# Patient Record
Sex: Female | Born: 1950
Health system: Southern US, Community
[De-identification: ages and names within clinical notes are randomized; demographics above are authoritative.]

## PROBLEM LIST (undated history)

## (undated) DIAGNOSIS — K635 Polyp of colon: Secondary | ICD-10-CM

## (undated) DIAGNOSIS — R42 Dizziness and giddiness: Secondary | ICD-10-CM

## (undated) DIAGNOSIS — M810 Age-related osteoporosis without current pathological fracture: Secondary | ICD-10-CM

## (undated) DIAGNOSIS — K649 Unspecified hemorrhoids: Secondary | ICD-10-CM

## (undated) DIAGNOSIS — F32A Depression, unspecified: Secondary | ICD-10-CM

## (undated) DIAGNOSIS — F329 Major depressive disorder, single episode, unspecified: Secondary | ICD-10-CM

## (undated) HISTORY — DX: Polyp of colon: K63.5

## (undated) HISTORY — DX: Dizziness and giddiness: R42

## (undated) HISTORY — DX: Depression, unspecified: F32.A

## (undated) HISTORY — DX: Age-related osteoporosis without current pathological fracture: M81.0

## (undated) HISTORY — DX: Unspecified hemorrhoids: K64.9

## (undated) HISTORY — DX: Major depressive disorder, single episode, unspecified: F32.9

---

## 1981-02-25 HISTORY — PX: TUBAL LIGATION: SHX77

## 1998-10-13 ENCOUNTER — Other Ambulatory Visit: Admission: RE | Admit: 1998-10-13 | Discharge: 1998-10-13 | Payer: Self-pay | Admitting: Obstetrics & Gynecology

## 2000-07-08 ENCOUNTER — Other Ambulatory Visit: Admission: RE | Admit: 2000-07-08 | Discharge: 2000-07-08 | Payer: Self-pay | Admitting: Obstetrics & Gynecology

## 2000-09-25 HISTORY — PX: COLONOSCOPY: SHX174

## 2000-10-16 ENCOUNTER — Ambulatory Visit (HOSPITAL_COMMUNITY): Admission: RE | Admit: 2000-10-16 | Discharge: 2000-10-16 | Payer: Self-pay | Admitting: Gastroenterology

## 2000-10-16 ENCOUNTER — Encounter (INDEPENDENT_AMBULATORY_CARE_PROVIDER_SITE_OTHER): Payer: Self-pay | Admitting: Specialist

## 2001-08-07 ENCOUNTER — Other Ambulatory Visit: Admission: RE | Admit: 2001-08-07 | Discharge: 2001-08-07 | Payer: Self-pay | Admitting: Obstetrics & Gynecology

## 2001-08-12 ENCOUNTER — Inpatient Hospital Stay (HOSPITAL_COMMUNITY): Admission: EM | Admit: 2001-08-12 | Discharge: 2001-08-15 | Payer: Self-pay

## 2001-08-12 ENCOUNTER — Encounter: Payer: Self-pay | Admitting: Specialist

## 2002-09-30 ENCOUNTER — Other Ambulatory Visit: Admission: RE | Admit: 2002-09-30 | Discharge: 2002-09-30 | Payer: Self-pay | Admitting: Obstetrics & Gynecology

## 2003-12-05 ENCOUNTER — Other Ambulatory Visit: Admission: RE | Admit: 2003-12-05 | Discharge: 2003-12-05 | Payer: Self-pay | Admitting: Obstetrics & Gynecology

## 2005-01-30 ENCOUNTER — Other Ambulatory Visit: Admission: RE | Admit: 2005-01-30 | Discharge: 2005-01-30 | Payer: Self-pay | Admitting: Obstetrics and Gynecology

## 2007-05-26 ENCOUNTER — Encounter: Admission: RE | Admit: 2007-05-26 | Discharge: 2007-05-26 | Payer: Self-pay | Admitting: Obstetrics & Gynecology

## 2007-06-10 ENCOUNTER — Encounter: Admission: RE | Admit: 2007-06-10 | Discharge: 2007-06-10 | Payer: Self-pay | Admitting: Obstetrics & Gynecology

## 2010-07-13 NOTE — H&P (Signed)
[facility]  Patient:    Melanie Chen, Melanie Chen Visit Number: [id number] MRN: [id number]          Service Type: SUR Location: 4W 0463 01 Attending Physician:  Lubertha South Dictated by:   Druscilla Brownie Shela Nevin, P.A. Admit Date:  08/12/2001                           History and Physical  CHIEF COMPLAINT:  "Pain in my left ankle and heel."  HISTORY OF PRESENT ILLNESS:  The patient is a 60 year old lady who was at her home last evening when she was descending some stairs.  She had a basketful of laundry in her hands.  She missed the last couple of steps of these stairs and twisted her ankle with immediate pain into her left foot and ankle.  They iced and elevated during the night but became more uncomfortable this morning, and she was brought to the emergency room where x-rays of the ankle reveal a nondisplaced fracture of the fibula.  The views of the calcaneus on the ankle x-ray showed irregularity, so the radiologist ordered further studies.  An MRI showed a displaced calcaneal fracture.  Dr. Otelia Sergeant is on call today and, after evaluation, may take the patient for surgical intervention.  PAST MEDICAL HISTORY:  The patient has been in good health throughout her lifetime.  She has only had a tubal ligation.  MEDICATIONS:  Aricept is the only medication.  SOCIAL HISTORY:  Negative for ETOH or alcohol.  ALLERGIES:  None known.  REVIEW OF SYSTEMS:  CNS:  No seizure disorder, paralysis, numbness, or double vision.  RESPIRATORY:  No productive cough, no hemoptysis, no shortness of breath.  CARDIOVASCULAR:  No chest pain, no angina, no orthopnea. GASTROINTESTINAL:  No nausea, vomiting, melena, or bloody stools. GENITOURINARY:  No discharge, dysuria, hematuria.  MUSCULOSKELETAL:  Primarily in present illness.  PHYSICAL EXAMINATION:  GENERAL:  Alert, cooperative, friendly 60 year old female who is fully alert. She is on an emergency room  stretcher.  VITAL SIGNS:  Blood pressure 132/80, pulse 58, respirations 20, temperature 97.3.  HEENT:  Normocephalic.  PERRLA.  Oropharynx is clear.  CHEST:  Clear to auscultation.  No rhonchi, no rales.  HEART:  Regular rate and rhythm.  No murmurs are heard.  ABDOMEN:  Soft and nontender.  Liver and spleen not felt.  GENITOURINARY, RECTAL, PELVIC, BREASTS:  Not done, not pertinent to present illness.  EXTREMITIES:  There is a moderate amount of swelling about the left ankle and heel with some discoloration.  NEUROLOGIC:  Intact.  ADMISSION DIAGNOSIS:  Left calcaneal fracture, nondisplaced fibular fracture on the left.  PLAN:  The patient will be placed in splint for rest and probably on to ORIF of the calcaneal fracture on the left. Dictated by:   Druscilla Brownie. Shela Nevin, P.A. Attending Physician:  Lubertha South DD:  08/12/01 TD:  08/13/01 Job: [id number]

## 2010-07-13 NOTE — Op Note (Signed)
[facility]  Patient:    Melanie Chen, Melanie Chen Visit Number: [id number] MRN: [id number]          Service Type: SUR Location: 4W 4782 01 Attending Physician:  Lubertha South Dictated by:   Kerrin Champagne, M.D. Proc. Date: 08/12/01 Admit Date:  08/12/2001                             Operative Report  PREOPERATIVE DIAGNOSES:  Left calcaneus fracture, tongue-type fracture.  POSTOPERATIVE DIAGNOSES:  Left calcaneus fracture, tongue-type fracture.  PROCEDURE:  Closed manipulation and reduction of left calcaneus fracture using a calcaneal traction pin under C-arm fluoro and percutaneous fixation of calcaneus fracture using two large Steinmann pins and application of a short leg cast in equinus position at 20 degrees.  SURGEON:  Kerrin Champagne, M.D.  ASSISTANT:  Della Goo, P.A.  ANESTHESIA:  GOT supplemented with local infiltration at the pin sites using 5 cc of Marcaine 0.5% plain in addition to the area of the calcaneal contraction pin area. Dr. Shireen Quan.  TOURNIQUET TIME:  Zero minutes.  ESTIMATED BLOOD LOSS:  5 cc.  BRIEF CLINICAL NOTE:  The patient is a 60 year old female who sustained a left calcaneal fracture when she missed the last two or three steps on a stairway landing solidly on her left heel last evening. She had immediate pain and discomfort, elevated it, used ice and presented to the emergency room this morning and was found to have a displaced left calcaneus fracture. CT scan demonstrated the intra-articular nature of the fracture with widening of the heel and apparent tongue-type fracture pattern into the posterior joint line. An anterior process fracture was also noted with CT scan and plain radiographs. She was brought to the operating room to undergo an attempt at closed manipulation and pinning, also open reduction internal fixation medially.  DESCRIPTION OF PROCEDURE:  After adequate general anesthesia, the patient  in prone position, chest rolls, all pressure points well padded, tourniquet about the left upper thigh not inflated. Standard preoperative antibiotics left lower extremity is prepped from the toes to the upper calf with duraprep solution, draped in the usual manner. C-arm fluoro is brought into the field even prior to draping and prepping to allow for examination of the fracture and determination of proper angles for examining the fracture intraoperatively. After sterile prep and drape, the C-arm fluoro was brought into the field. A calcaneal pin was placed over the inferior posterior aspect of the calcaneal tuberosity to allow for traction purposes and placing a pin wall away from where the expected internal fixation pins will be placed. The patients fracture was then reduced with longitudinal traction, pressure over the lateral aspect of the mid portion of the calcaneus just posterior inferior to the lateral malleolus which also was fractured. Initially this was felt to reduce the fracture. A Steinmann pin was placed through a stab incision over the posterior mid upper 1/3 of the calcaneal tuberosity through a stab incision. A large Steinmann pin was passed parallel to the lateral cortex initially placed. Examination using C-arm fluoro demonstrated a good position and alignment of both Broden views as well as lateral views and AP views; however, Tiburcio Pea view demonstrated that the fracture still remained displaced and widened. Therefore this pin was then replaced laterally and then used to apply medial pressure against the lateral calcaneal fracture fragment reducing the posterior joint surface and the widened aspect of the heel.  Reducing the sustentacular fragment to this fragment. The pin was then driven across into the talus and found its way into the talocuboid region near the area of the anterior process and did appear to provide some fixation of the anterior process fracture that was  present here. Next, a second stab incision was made over the posterior medial aspect of the mid upper third of the calcaneus and a second large Steinmann pin was then passed through the calcaneus in line w the medial cortex of the calcaneus entering into the sustentacular fragment and fixing this fragment in addition to the talar neck. This held both the medial and lateral fracture fragments of the calcaneus reduced in a proper orientation with reduction of the widened aspect of the calcaneal body. Views were then taken both AP view, lateral views as well as several Ill[place] Level views were obtained, each demonstrating excellent reduction of the posterior joint surface of the calcaneus. The anterior process fracture fragment also appeared to be reduced nicely. The fibular fracture site also demonstrated good appearance of the AP and lateral views. Each of the long Steinmann pins were then carefully bent. The traction pin was removed, cut medially and then withdrawn through the lateral aspect of the heel. Xeroform gauze placed over the stab incisions for insertion of this pin. Additional xeroform placed at the pin skin interval for both the large Steinmann pins over the posterior heel. The 4 x 4s fixed to the skin with sterile Webril. A well padded short leg fiberglass cast was applied with the ankle in about 20 degrees of equinus. Care was taken to pad each of the bent Steinmann pins. Also note the Steinmann pins had caps placed over them to protect them from the cast material itself. The patient was then returned to a supine position, reactivated and returned to the recovery room in satisfactory condition. All instrument and sponge counts were correct. Dictated by:   Kerrin Champagne, M.D. Attending Physician:  Lubertha South DD:  08/12/01 TD:  08/13/01 Job: [id number]

## 2010-07-13 NOTE — Op Note (Signed)
[facility]  Patient:    Melanie Chen, Melanie Chen Visit Number: [id number] MRN: [id number]          Service Type: END Location: ENDO Attending Physician:  Nelda Marseille Proc. Date: 10/16/00 Adm. Date:  10/16/2000   CC:         Freddy Finner, M.D.   Operative Report  PROCEDURE:  Colonoscopy with biopsy.  INDICATIONS FOR PROCEDURE:  Family history of colon cancer.  Consent was signed after risks, benefits, methods, and options were thoroughly discussed in the office.  MEDICINES USED:  Demerol 70, Versed 7.  DESCRIPTION OF PROCEDURE:  Rectal inspection was pertinent for small external hemorrhoids. Digital exam was negative. The video pediatric colonoscope was inserted and with lots of difficulty due to a tortuous colon, we were able to advance to the cecum. This did require first rolling her on her back and then on her right side and various abdominal pressures. The patient tolerated insertion well. We just had trouble controlling the loop and some trouble with some tortuosity. No obvious abnormality was seen on insertion. The cecum was identified by the appendiceal orifice and the ileocecal valve. In fact, the scope was inserted a short ways into the terminal ileum which was normal. The scope was then slowly withdrawn, the TI was normal. The prep was adequate. There was some liquid stool that required washing and suctioning. On slow withdrawal through the colon, the cecum and the ascending were normal. In the proximal transverse colon, a questionable tiny 2 mm polyp was seen and was cold biopsied x 3. The scope was further withdrawn. When we fell back around a tortuous curve, we did readvance around the curve to try to decrease the chance of missing things but on slow withdrawal back to the rectum, no additional findings were seen. Once back in the rectum, the scope was retroflexed pertinent for some internal hemorrhoids. The scope  was straightened, air was suctioned, scope removed. The patient tolerated the procedure well. There was no obvious or immediate complications.  ENDOSCOPIC DIAGNOSIS: 1. Small internal/external hemorrhoids. 2. Tortuous sigmoid and flexure. 3. Questionable tiny proximal transverse polyp status post cold biopsy. 4. Otherwise within normal limits to the cecum and the terminal ileum.  PLAN:  GI follow-up p.r.n.  Await pathology. Will probably recheck colonic screening in five years with contrast barium enema or a virtual colonoscopy at one time at that juncture due to her tortuosity. Otherwise return care to Dr. Jennette Kettle for the customary health care maintenance to include yearly rectals and guaiacs. Attending Physician:  Nelda Marseille DD:  10/16/00 TD:  10/17/00 Job: [id number]

## 2014-03-29 DIAGNOSIS — Z6822 Body mass index (BMI) 22.0-22.9, adult: Secondary | ICD-10-CM | POA: Diagnosis not present

## 2014-03-29 DIAGNOSIS — Z01419 Encounter for gynecological examination (general) (routine) without abnormal findings: Secondary | ICD-10-CM | POA: Diagnosis not present

## 2014-03-29 DIAGNOSIS — Z124 Encounter for screening for malignant neoplasm of cervix: Secondary | ICD-10-CM | POA: Diagnosis not present

## 2014-09-10 DIAGNOSIS — R42 Dizziness and giddiness: Secondary | ICD-10-CM

## 2014-09-10 HISTORY — DX: Dizziness and giddiness: R42

## 2014-10-25 DIAGNOSIS — M810 Age-related osteoporosis without current pathological fracture: Secondary | ICD-10-CM | POA: Diagnosis not present

## 2014-10-25 DIAGNOSIS — F329 Major depressive disorder, single episode, unspecified: Secondary | ICD-10-CM | POA: Diagnosis not present

## 2014-10-25 DIAGNOSIS — R42 Dizziness and giddiness: Secondary | ICD-10-CM | POA: Diagnosis not present

## 2014-10-25 DIAGNOSIS — R112 Nausea with vomiting, unspecified: Secondary | ICD-10-CM | POA: Diagnosis not present

## 2014-11-04 DIAGNOSIS — G9389 Other specified disorders of brain: Secondary | ICD-10-CM | POA: Diagnosis not present

## 2014-11-16 DIAGNOSIS — M816 Localized osteoporosis [Lequesne]: Secondary | ICD-10-CM | POA: Diagnosis not present

## 2014-11-16 DIAGNOSIS — N958 Other specified menopausal and perimenopausal disorders: Secondary | ICD-10-CM | POA: Diagnosis not present

## 2014-11-17 DIAGNOSIS — Z136 Encounter for screening for cardiovascular disorders: Secondary | ICD-10-CM | POA: Diagnosis not present

## 2014-11-17 DIAGNOSIS — R42 Dizziness and giddiness: Secondary | ICD-10-CM | POA: Diagnosis not present

## 2014-11-17 DIAGNOSIS — Z0000 Encounter for general adult medical examination without abnormal findings: Secondary | ICD-10-CM | POA: Diagnosis not present

## 2014-11-17 DIAGNOSIS — Z411 Encounter for cosmetic surgery: Secondary | ICD-10-CM | POA: Diagnosis not present

## 2014-11-17 DIAGNOSIS — L821 Other seborrheic keratosis: Secondary | ICD-10-CM | POA: Diagnosis not present

## 2014-11-17 DIAGNOSIS — R938 Abnormal findings on diagnostic imaging of other specified body structures: Secondary | ICD-10-CM | POA: Diagnosis not present

## 2014-11-17 DIAGNOSIS — Z23 Encounter for immunization: Secondary | ICD-10-CM | POA: Diagnosis not present

## 2014-11-25 ENCOUNTER — Encounter: Payer: Self-pay | Admitting: *Deleted

## 2014-11-29 ENCOUNTER — Ambulatory Visit (INDEPENDENT_AMBULATORY_CARE_PROVIDER_SITE_OTHER): Payer: Medicare Other | Admitting: Diagnostic Neuroimaging

## 2014-11-29 ENCOUNTER — Encounter: Payer: Self-pay | Admitting: Diagnostic Neuroimaging

## 2014-11-29 VITALS — BP 129/80 | HR 69 | Ht 65.5 in | Wt 131.2 lb

## 2014-11-29 DIAGNOSIS — R9089 Other abnormal findings on diagnostic imaging of central nervous system: Secondary | ICD-10-CM

## 2014-11-29 DIAGNOSIS — R930 Abnormal findings on diagnostic imaging of skull and head, not elsewhere classified: Secondary | ICD-10-CM | POA: Diagnosis not present

## 2014-11-29 DIAGNOSIS — R42 Dizziness and giddiness: Secondary | ICD-10-CM

## 2014-11-29 NOTE — Patient Instructions (Signed)

## 2014-11-29 NOTE — Progress Notes (Signed)
[facility]  PATIENT: Melanie Chen DOB: 1951-01-26  REFERRING CLINICIAN: Sabra Heck  HISTORY FROM: patient  REASON FOR VISIT: new consult    HISTORICAL  CHIEF COMPLAINT:  Chief Complaint  Patient presents with  . Dizziness    rm 7, Abnormal MRI, New Patient    HISTORY OF PRESENT ILLNESS:   64 year old left-handed female here for evaluation of dizziness and nausea and vomiting.   09/10/14 patient woke up feeling off balance and dizzy staggering. She has significant nausea. She was unable to stand and walk. She declined going to the emergency room and slept in the bathroom all night.  09/23/14 patient had a similar but less severe attack.   10/04/14 patient is similar but less severe attack.  No ringing in ears. No double vision. No slurred speech or trouble talking. Symptoms seem to have been aggravated by movement. Symptoms better if she was laying down particularly on her left side. She denies any significant spinning sensation but felt as if she was being pulled or falling to the left or right side. No prodromal infections, traumas, accidents. Patient had some headaches with these attacks.  Patient is now a systematic and doing well.   REVIEW OF SYSTEMS: Full 14 system review of systems performed and notable only for dizziness.   ALLERGIES: No Known Allergies  HOME MEDICATIONS: No outpatient prescriptions prior to visit.   No facility-administered medications prior to visit.    PAST MEDICAL HISTORY: Past Medical History  Diagnosis Date  . Hemorrhoids     pt unsure  . Depression   . Osteoporosis   . Colon polyp   . Dizziness 09/10/14    PAST SURGICAL HISTORY: Past Surgical History  Procedure Laterality Date  . Tubal ligation  1983  . Colonoscopy  09/2000    FAMILY HISTORY: Family History  Problem Relation Age of Onset  . Cancer Father     colon  . Stroke Father   . Hypertension Mother     SOCIAL HISTORY:  Social History    Social History  . Marital Status: Married    Spouse Name: [name]  . Number of Children: 2  . Years of Education: 16   Occupational History  .      retired   Social History Main Topics  . Smoking status: Never Smoker   . Smokeless tobacco: Not on file  . Alcohol Use: 0.0 oz/week    0 Standard drinks or equivalent per week     Comment: occas wine, 2-3 x week  . Drug Use: No  . Sexual Activity: Not on file   Other Topics Concern  . Not on file   Social History Narrative      Lives at home with husband   Caffeine use- Coffee- 3 cups daily, soda 2 a week     PHYSICAL EXAM  GENERAL EXAM/CONSTITUTIONAL: Vitals:  Filed Vitals:   11/29/14 1103  BP: 129/80  Pulse: 69  Height: 5' 5.5" (1.664 m)  Weight: 131 lb 3.2 oz (59.512 kg)     Body mass index is 21.49 kg/(m^2).  Visual Acuity Screening   Right eye Left eye Both eyes  Without correction:     With correction: 20/30 20/50      Patient is in no distress; well developed, nourished and groomed; neck is supple  CARDIOVASCULAR:  Examination of carotid arteries is normal; no carotid bruits  Regular rate and rhythm, no murmurs  Examination of peripheral vascular system by observation and palpation  is normal  EYES:  Ophthalmoscopic exam of optic discs and posterior segments is normal; no papilledema or hemorrhages  MUSCULOSKELETAL:  Gait, strength, tone, movements noted in Neurologic exam below  NEUROLOGIC: MENTAL STATUS:  No flowsheet data found.  awake, alert, oriented to person, place and time  recent and remote memory intact  normal attention and concentration  language fluent, comprehension intact, naming intact,   fund of knowledge appropriate  CRANIAL NERVE:   2nd - no papilledema on fundoscopic exam  2nd, 3rd, 4th, 6th - pupils equal and reactive to light, visual fields full to confrontation, extraocular muscles intact, no nystagmus  5th - facial sensation symmetric  7th - facial  strength symmetric  8th - hearing intact  9th - palate elevates symmetrically, uvula midline  11th - shoulder shrug symmetric  12th - tongue protrusion midline  MOTOR:   normal bulk and tone, full strength in the BUE, BLE  SENSORY:   normal and symmetric to light touch, temperature, vibration   COORDINATION:   finger-nose-finger, fine finger movements normal  REFLEXES:   deep tendon reflexes present and symmetric  GAIT/STATION:   narrow based gait; able to walk on toes, heels and tandem; romberg is negative    DIAGNOSTIC DATA (LABS, IMAGING, TESTING) - I reviewed patient records, labs, notes, testing and imaging myself where available.  No results found for: WBC, HGB, HCT, MCV, PLT No results found for: NA, K, CL, CO2, GLUCOSE, BUN, CREATININE, CALCIUM, PROT, ALBUMIN, AST, ALT, ALKPHOS, BILITOT, GFRNONAA, GFRAA No results found for: CHOL, HDL, LDLCALC, LDLDIRECT, TRIG, CHOLHDL No results found for: HGBA1C No results found for: VITAMINB12 No results found for: TSH   11/04/14 MRI brain [I reviewed images myself and agree with interpretation. -VRP]  - Scattered foci of T2 hyperintensity in the subcortical white matter and periventricular white matter could be related to chronic small vessel ischemic disease. - Encephalomalacia and gliosis in the right anterior temporal lobe may be related to prior trauma or infarction.     ASSESSMENT AND PLAN  64 y.o. year old female here with 3 attacks of dizziness, nausea, vomiting, now resolved. No specific triggering factors. Most likely represents some form of peripheral vestibulopathy.   No acute findings on MRI study, but multiple foci of white matter disease as well as possible remote trauma or infarction in the right temporal lobe.Burnis Medin repeat MRI in 6 months to ensure stability.    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 MR Brain/IAC Wo/W Cm   Return in about 6 months (around 05/30/2015) for after MRI brain.    Penni Bombard, MD 92/02/1939, 74:08 AM Certified in Neurology, Neurophysiology and Neuroimaging  [facility] [address], [place], [place] [address] [phone]

## 2014-12-07 DIAGNOSIS — M818 Other osteoporosis without current pathological fracture: Secondary | ICD-10-CM | POA: Diagnosis not present

## 2015-03-22 DIAGNOSIS — M818 Other osteoporosis without current pathological fracture: Secondary | ICD-10-CM | POA: Diagnosis not present

## 2015-04-10 DIAGNOSIS — M810 Age-related osteoporosis without current pathological fracture: Secondary | ICD-10-CM | POA: Diagnosis not present

## 2015-04-10 DIAGNOSIS — Z01419 Encounter for gynecological examination (general) (routine) without abnormal findings: Secondary | ICD-10-CM | POA: Diagnosis not present

## 2015-04-10 DIAGNOSIS — Z6821 Body mass index (BMI) 21.0-21.9, adult: Secondary | ICD-10-CM | POA: Diagnosis not present

## 2015-06-09 DIAGNOSIS — R42 Dizziness and giddiness: Secondary | ICD-10-CM | POA: Diagnosis not present

## 2015-06-20 ENCOUNTER — Other Ambulatory Visit: Payer: Self-pay | Admitting: Diagnostic Neuroimaging

## 2015-06-20 ENCOUNTER — Ambulatory Visit (INDEPENDENT_AMBULATORY_CARE_PROVIDER_SITE_OTHER): Payer: Self-pay

## 2015-06-20 DIAGNOSIS — Z0289 Encounter for other administrative examinations: Secondary | ICD-10-CM

## 2015-06-20 DIAGNOSIS — R42 Dizziness and giddiness: Secondary | ICD-10-CM

## 2015-06-20 DIAGNOSIS — R9089 Other abnormal findings on diagnostic imaging of central nervous system: Secondary | ICD-10-CM

## 2015-07-05 ENCOUNTER — Telehealth: Payer: Self-pay | Admitting: Diagnostic Neuroimaging

## 2015-07-05 NOTE — Telephone Encounter (Signed)
Spoke with patient and per Dr Rexene Alberts, informed her that her MRI results showed stable findings, no acute changes, no abnormal contrast uptake. Scheduled her for follow up with Dr Leta Baptist to discuss further. She verbalized understanding, appreciation for call.

## 2015-07-05 NOTE — Telephone Encounter (Signed)
WORK IN DOCTOR:  Patient is calling to get the results of her MRI brain done on 06/09/15 by [facility] @[phone].  Please call.

## 2015-07-05 NOTE — Telephone Encounter (Signed)
Mary: Please advise patient that recent brain MRI showed stable findings, no acute changes, no abnormal contrast uptake. Patient is encouraged to discuss during FU appointment with Dr. Leta Baptist further, please assist with making a follow-up appointment as this was the plan after MRI was completed.

## 2015-07-18 ENCOUNTER — Ambulatory Visit (INDEPENDENT_AMBULATORY_CARE_PROVIDER_SITE_OTHER): Payer: Medicare Other | Admitting: Diagnostic Neuroimaging

## 2015-07-18 ENCOUNTER — Encounter: Payer: Self-pay | Admitting: Diagnostic Neuroimaging

## 2015-07-18 VITALS — BP 123/76 | HR 71 | Ht 65.5 in | Wt 129.8 lb

## 2015-07-18 DIAGNOSIS — R9089 Other abnormal findings on diagnostic imaging of central nervous system: Secondary | ICD-10-CM

## 2015-07-18 DIAGNOSIS — R930 Abnormal findings on diagnostic imaging of skull and head, not elsewhere classified: Secondary | ICD-10-CM

## 2015-07-18 DIAGNOSIS — R42 Dizziness and giddiness: Secondary | ICD-10-CM

## 2015-07-18 NOTE — Patient Instructions (Signed)

## 2015-07-18 NOTE — Progress Notes (Signed)
[facility]  PATIENT: Melanie Chen DOB: 12/09/1950  REFERRING CLINICIAN: Sabra Heck  HISTORY FROM: patient  REASON FOR VISIT: follow up   HISTORICAL  CHIEF COMPLAINT:  Chief Complaint  Patient presents with  . Dizziness    rm 7, husband- [name], "review MRI results, dizziness/vomiting episodes have not reoccured in past 6 months"  . Follow-up    HISTORY OF PRESENT ILLNESS:   UPDATE 07/17/65: Since last visit, doing well. No further dizzy attacks. Patient is doing yoga, eating clean, and taking care of her general health.   PRIOR HPI (11/29/14): 65 year old left-handed female here for evaluation of dizziness and nausea and vomiting. 09/10/14 patient woke up feeling off balance and dizzy staggering. She has significant nausea. She was unable to stand and walk. She declined going to the emergency room and slept in the bathroom all night. 09/23/14 patient had a similar but less severe attack.  10/04/14 patient is similar but less severe attack. No ringing in ears. No double vision. No slurred speech or trouble talking. Symptoms seem to have been aggravated by movement. Symptoms better if she was laying down particularly on her left side. She denies any significant spinning sensation but felt as if she was being pulled or falling to the left or right side. No prodromal infections, traumas, accidents. Patient had some headaches with these attacks. Patient is now asymptomatic and doing well.   REVIEW OF SYSTEMS: Full 14 system review of systems performed and negative except for joint pain.    ALLERGIES: No Known Allergies  HOME MEDICATIONS: Outpatient Prescriptions Prior to Visit  Medication Sig Dispense Refill  . aspirin 81 MG tablet Take 81 mg by mouth daily.    . Calcium Citrate-Vitamin D (CALCIUM + D PO) Take by mouth. 1200 mg    . sertraline (ZOLOFT) 50 MG tablet Take 50 mg by mouth daily.  1  . ondansetron (ZOFRAN) 4 MG tablet 4 mg. Reported on 07/18/2015  0  .  raloxifene (EVISTA) 60 MG tablet Take 60 mg by mouth daily.  9   No facility-administered medications prior to visit.    PAST MEDICAL HISTORY: Past Medical History  Diagnosis Date  . Hemorrhoids     pt unsure  . Depression   . Osteoporosis   . Colon polyp   . Dizziness 09/10/14    PAST SURGICAL HISTORY: Past Surgical History  Procedure Laterality Date  . Tubal ligation  1983  . Colonoscopy  09/2000    FAMILY HISTORY: Family History  Problem Relation Age of Onset  . Cancer Father     colon  . Stroke Father   . Hypertension Mother     SOCIAL HISTORY:  Social History   Social History  . Marital Status: Married    Spouse Name: [name]  . Number of Children: 2  . Years of Education: 16   Occupational History  .      retired   Social History Main Topics  . Smoking status: Never Smoker   . Smokeless tobacco: Not on file  . Alcohol Use: 0.0 oz/week    0 Standard drinks or equivalent per week     Comment: occas wine, 2-3 x week  . Drug Use: No  . Sexual Activity: Not on file   Other Topics Concern  . Not on file   Social History Narrative      Lives at home with husband   Caffeine use- Coffee- 3 cups daily, soda 2 a week  PHYSICAL EXAM  GENERAL EXAM/CONSTITUTIONAL: Vitals:  Filed Vitals:   07/18/15 1131  BP: 123/76  Pulse: 71  Height: 5' 5.5" (1.664 m)  Weight: 129 lb 12.8 oz (58.877 kg)   Body mass index is 21.26 kg/(m^2). No exam data present  Patient is in no distress; well developed, nourished and groomed; neck is supple  CARDIOVASCULAR:  Examination of carotid arteries is normal; no carotid bruits  Regular rate and rhythm, no murmurs  Examination of peripheral vascular system by observation and palpation is normal  EYES:  Ophthalmoscopic exam of optic discs and posterior segments is normal; no papilledema or hemorrhages  MUSCULOSKELETAL:  Gait, strength, tone, movements noted in Neurologic exam below  NEUROLOGIC: MENTAL  STATUS:  No flowsheet data found.  awake, alert, oriented to person, place and time  recent and remote memory intact  normal attention and concentration  language fluent, comprehension intact, naming intact,   fund of knowledge appropriate  CRANIAL NERVE:   2nd - no papilledema on fundoscopic exam  2nd, 3rd, 4th, 6th - pupils equal and reactive to light, visual fields full to confrontation, extraocular muscles intact, no nystagmus  5th - facial sensation symmetric  7th - facial strength symmetric  8th - hearing intact  9th - palate elevates symmetrically, uvula midline  11th - shoulder shrug symmetric  12th - tongue protrusion midline  MOTOR:   normal bulk and tone, full strength in the BUE, BLE  SENSORY:   normal and symmetric to light touch, temperature, vibration   COORDINATION:   finger-nose-finger, fine finger movements normal  REFLEXES:   deep tendon reflexes present and symmetric  GAIT/STATION:   narrow based gait; able to walk tandem; romberg is negative    DIAGNOSTIC DATA (LABS, IMAGING, TESTING) - I reviewed patient records, labs, notes, testing and imaging myself where available.  No results found for: WBC, HGB, HCT, MCV, PLT No results found for: NA, K, CL, CO2, GLUCOSE, BUN, CREATININE, CALCIUM, PROT, ALBUMIN, AST, ALT, ALKPHOS, BILITOT, GFRNONAA, GFRAA No results found for: CHOL, HDL, LDLCALC, LDLDIRECT, TRIG, CHOLHDL No results found for: HGBA1C No results found for: VITAMINB12 No results found for: TSH   11/04/14 MRI brain [I reviewed images myself and agree with interpretation. -VRP]  - Scattered foci of T2 hyperintensity in the subcortical white matter and periventricular white matter could be related to chronic small vessel ischemic disease. - Encephalomalacia and gliosis in the right anterior temporal lobe may be related to prior trauma or infarction.  06/20/15 MRI brain [I reviewed images myself and agree with interpretation. -VRP]   1. Right temporal superior and anterior gliosis (subcortical and juxtacortical) could be related to prior infarction, trauma or inflammation.  2. Scattered periventricular and subcortical foci of chronic small vessel ischemic disease.  3. No acute findings. No abnormal enhancing lesions.     ASSESSMENT AND PLAN  65 y.o. year old female here with 3 attacks of dizziness, nausea, vomiting, now resolved. No specific triggering factors. Most likely represents some form of peripheral vestibulopathy.   No acute findings on MRI study, but multiple foci of white matter disease as well as possible remote trauma or infarction in the right temporal lobe. Scan stable.   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 Greater than 50% of time was spent in counseling and coordination of care with patient. In summary we discussed:  -  monitor symptoms - follow up as needed  Return if symptoms worsen or fail to improve, for return to PCP.    Penni Bombard, MD [id number], 0000000 AM Certified in Neurology, Neurophysiology and Neuroimaging  [facility] [address] [place], [place] [address] [phone]

## 2015-11-16 DIAGNOSIS — M818 Other osteoporosis without current pathological fracture: Secondary | ICD-10-CM | POA: Diagnosis not present

## 2015-11-27 DIAGNOSIS — M810 Age-related osteoporosis without current pathological fracture: Secondary | ICD-10-CM | POA: Diagnosis not present

## 2016-04-09 DIAGNOSIS — R05 Cough: Secondary | ICD-10-CM | POA: Diagnosis not present

## 2016-05-03 DIAGNOSIS — H40023 Open angle with borderline findings, high risk, bilateral: Secondary | ICD-10-CM | POA: Diagnosis not present

## 2016-05-03 DIAGNOSIS — H52223 Regular astigmatism, bilateral: Secondary | ICD-10-CM | POA: Diagnosis not present

## 2016-05-03 DIAGNOSIS — H353132 Nonexudative age-related macular degeneration, bilateral, intermediate dry stage: Secondary | ICD-10-CM | POA: Diagnosis not present

## 2016-05-03 DIAGNOSIS — H524 Presbyopia: Secondary | ICD-10-CM | POA: Diagnosis not present

## 2016-05-03 DIAGNOSIS — H5213 Myopia, bilateral: Secondary | ICD-10-CM | POA: Diagnosis not present

## 2016-05-03 DIAGNOSIS — H2513 Age-related nuclear cataract, bilateral: Secondary | ICD-10-CM | POA: Diagnosis not present

## 2016-05-13 DIAGNOSIS — M810 Age-related osteoporosis without current pathological fracture: Secondary | ICD-10-CM | POA: Diagnosis not present

## 2016-05-13 DIAGNOSIS — Z6821 Body mass index (BMI) 21.0-21.9, adult: Secondary | ICD-10-CM | POA: Diagnosis not present

## 2016-05-13 DIAGNOSIS — Z124 Encounter for screening for malignant neoplasm of cervix: Secondary | ICD-10-CM | POA: Diagnosis not present

## 2016-05-13 DIAGNOSIS — Z1231 Encounter for screening mammogram for malignant neoplasm of breast: Secondary | ICD-10-CM | POA: Diagnosis not present

## 2016-05-13 DIAGNOSIS — Z780 Asymptomatic menopausal state: Secondary | ICD-10-CM | POA: Diagnosis not present

## 2016-05-13 DIAGNOSIS — M818 Other osteoporosis without current pathological fracture: Secondary | ICD-10-CM | POA: Diagnosis not present

## 2016-06-06 DIAGNOSIS — M818 Other osteoporosis without current pathological fracture: Secondary | ICD-10-CM | POA: Diagnosis not present

## 2016-11-19 DIAGNOSIS — N958 Other specified menopausal and perimenopausal disorders: Secondary | ICD-10-CM | POA: Diagnosis not present

## 2016-11-19 DIAGNOSIS — M816 Localized osteoporosis [Lequesne]: Secondary | ICD-10-CM | POA: Diagnosis not present

## 2016-11-22 DIAGNOSIS — H40023 Open angle with borderline findings, high risk, bilateral: Secondary | ICD-10-CM | POA: Diagnosis not present

## 2016-11-22 DIAGNOSIS — H2513 Age-related nuclear cataract, bilateral: Secondary | ICD-10-CM | POA: Diagnosis not present

## 2016-11-22 DIAGNOSIS — H353132 Nonexudative age-related macular degeneration, bilateral, intermediate dry stage: Secondary | ICD-10-CM | POA: Diagnosis not present

## 2017-02-28 DIAGNOSIS — H353132 Nonexudative age-related macular degeneration, bilateral, intermediate dry stage: Secondary | ICD-10-CM | POA: Diagnosis not present

## 2017-02-28 DIAGNOSIS — H52223 Regular astigmatism, bilateral: Secondary | ICD-10-CM | POA: Diagnosis not present

## 2017-02-28 DIAGNOSIS — H2513 Age-related nuclear cataract, bilateral: Secondary | ICD-10-CM | POA: Diagnosis not present

## 2017-02-28 DIAGNOSIS — H40023 Open angle with borderline findings, high risk, bilateral: Secondary | ICD-10-CM | POA: Diagnosis not present

## 2017-02-28 DIAGNOSIS — H524 Presbyopia: Secondary | ICD-10-CM | POA: Diagnosis not present

## 2017-06-23 DIAGNOSIS — Z01419 Encounter for gynecological examination (general) (routine) without abnormal findings: Secondary | ICD-10-CM | POA: Diagnosis not present

## 2017-06-23 DIAGNOSIS — Z6821 Body mass index (BMI) 21.0-21.9, adult: Secondary | ICD-10-CM | POA: Diagnosis not present

## 2017-06-30 DIAGNOSIS — M859 Disorder of bone density and structure, unspecified: Secondary | ICD-10-CM | POA: Diagnosis not present

## 2017-07-29 DIAGNOSIS — H524 Presbyopia: Secondary | ICD-10-CM | POA: Diagnosis not present

## 2017-07-29 DIAGNOSIS — H2513 Age-related nuclear cataract, bilateral: Secondary | ICD-10-CM | POA: Diagnosis not present

## 2017-07-29 DIAGNOSIS — H353132 Nonexudative age-related macular degeneration, bilateral, intermediate dry stage: Secondary | ICD-10-CM | POA: Diagnosis not present

## 2017-07-29 DIAGNOSIS — H40023 Open angle with borderline findings, high risk, bilateral: Secondary | ICD-10-CM | POA: Diagnosis not present

## 2017-07-29 DIAGNOSIS — H52223 Regular astigmatism, bilateral: Secondary | ICD-10-CM | POA: Diagnosis not present

## 2018-01-14 DIAGNOSIS — M859 Disorder of bone density and structure, unspecified: Secondary | ICD-10-CM | POA: Diagnosis not present

## 2018-01-29 DIAGNOSIS — M859 Disorder of bone density and structure, unspecified: Secondary | ICD-10-CM | POA: Diagnosis not present

## 2018-01-29 DIAGNOSIS — M818 Other osteoporosis without current pathological fracture: Secondary | ICD-10-CM | POA: Diagnosis not present

## 2018-01-30 DIAGNOSIS — H353132 Nonexudative age-related macular degeneration, bilateral, intermediate dry stage: Secondary | ICD-10-CM | POA: Diagnosis not present

## 2018-01-30 DIAGNOSIS — H40023 Open angle with borderline findings, high risk, bilateral: Secondary | ICD-10-CM | POA: Diagnosis not present

## 2018-01-30 DIAGNOSIS — H2513 Age-related nuclear cataract, bilateral: Secondary | ICD-10-CM | POA: Diagnosis not present

## 2018-08-03 DIAGNOSIS — M8580 Other specified disorders of bone density and structure, unspecified site: Secondary | ICD-10-CM | POA: Diagnosis not present

## 2018-08-12 DIAGNOSIS — Z01419 Encounter for gynecological examination (general) (routine) without abnormal findings: Secondary | ICD-10-CM | POA: Diagnosis not present

## 2018-08-12 DIAGNOSIS — Z6821 Body mass index (BMI) 21.0-21.9, adult: Secondary | ICD-10-CM | POA: Diagnosis not present

## 2018-08-12 DIAGNOSIS — M810 Age-related osteoporosis without current pathological fracture: Secondary | ICD-10-CM | POA: Diagnosis not present

## 2018-08-19 DIAGNOSIS — Z1231 Encounter for screening mammogram for malignant neoplasm of breast: Secondary | ICD-10-CM | POA: Diagnosis not present

## 2018-11-23 DIAGNOSIS — H353132 Nonexudative age-related macular degeneration, bilateral, intermediate dry stage: Secondary | ICD-10-CM | POA: Diagnosis not present

## 2018-11-23 DIAGNOSIS — H40023 Open angle with borderline findings, high risk, bilateral: Secondary | ICD-10-CM | POA: Diagnosis not present

## 2018-11-23 DIAGNOSIS — H52223 Regular astigmatism, bilateral: Secondary | ICD-10-CM | POA: Diagnosis not present

## 2018-11-23 DIAGNOSIS — H2513 Age-related nuclear cataract, bilateral: Secondary | ICD-10-CM | POA: Diagnosis not present

## 2018-11-24 DIAGNOSIS — Z1159 Encounter for screening for other viral diseases: Secondary | ICD-10-CM | POA: Diagnosis not present

## 2018-11-27 DIAGNOSIS — D121 Benign neoplasm of appendix: Secondary | ICD-10-CM | POA: Diagnosis not present

## 2018-11-27 DIAGNOSIS — Z1211 Encounter for screening for malignant neoplasm of colon: Secondary | ICD-10-CM | POA: Diagnosis not present

## 2018-11-27 DIAGNOSIS — Z800 Family history of malignant neoplasm of digestive organs: Secondary | ICD-10-CM | POA: Diagnosis not present

## 2018-12-02 DIAGNOSIS — D121 Benign neoplasm of appendix: Secondary | ICD-10-CM | POA: Diagnosis not present

## 2018-12-07 DIAGNOSIS — H2512 Age-related nuclear cataract, left eye: Secondary | ICD-10-CM | POA: Diagnosis not present

## 2018-12-07 DIAGNOSIS — H2513 Age-related nuclear cataract, bilateral: Secondary | ICD-10-CM | POA: Diagnosis not present

## 2018-12-15 DIAGNOSIS — H2512 Age-related nuclear cataract, left eye: Secondary | ICD-10-CM | POA: Diagnosis not present

## 2018-12-15 DIAGNOSIS — H25812 Combined forms of age-related cataract, left eye: Secondary | ICD-10-CM | POA: Diagnosis not present

## 2018-12-21 DIAGNOSIS — H2511 Age-related nuclear cataract, right eye: Secondary | ICD-10-CM | POA: Diagnosis not present

## 2018-12-29 DIAGNOSIS — H25811 Combined forms of age-related cataract, right eye: Secondary | ICD-10-CM | POA: Diagnosis not present

## 2018-12-29 DIAGNOSIS — H2511 Age-related nuclear cataract, right eye: Secondary | ICD-10-CM | POA: Diagnosis not present

## 2019-01-01 DIAGNOSIS — Z23 Encounter for immunization: Secondary | ICD-10-CM | POA: Diagnosis not present

## 2019-01-27 DIAGNOSIS — Z23 Encounter for immunization: Secondary | ICD-10-CM | POA: Diagnosis not present

## 2019-04-05 ENCOUNTER — Ambulatory Visit: Payer: Medicare Other | Attending: Internal Medicine

## 2019-04-05 DIAGNOSIS — Z23 Encounter for immunization: Secondary | ICD-10-CM

## 2019-04-05 NOTE — Progress Notes (Signed)
   Covid-19 Vaccination Clinic  Name:  [name]    MRN: [id number] DOB: 01-24-1951  04/05/2019  [name] was observed post Covid-19 immunization for 15 minutes without incidence. She was provided with Vaccine Information Sheet and instruction to access the V-Safe system.   [name] was instructed to call 911 with any severe reactions post vaccine: Marland Kitchen Difficulty breathing  . Swelling of your face and throat  . A fast heartbeat  . A bad rash all over your body  . Dizziness and weakness    Immunizations Administered    Name Date Dose VIS Date Route   Pfizer COVID-19 Vaccine 04/05/2019  4:52 PM 0.3 mL 02/05/2019 Intramuscular   Manufacturer: [place]   Lot: [id number]   [place]: [id number]

## 2019-04-30 ENCOUNTER — Ambulatory Visit: Payer: Federal, State, Local not specified - PPO

## 2019-04-30 ENCOUNTER — Ambulatory Visit: Payer: Medicare Other | Attending: Internal Medicine

## 2019-04-30 DIAGNOSIS — Z23 Encounter for immunization: Secondary | ICD-10-CM | POA: Insufficient documentation

## 2019-04-30 NOTE — Progress Notes (Signed)
   Covid-19 Vaccination Clinic  Name:  Melanie Chen    MRN: [id number] DOB: 08/31/1950  04/30/2019  [name] was observed post Covid-19 immunization for 15 minutes without incident. She was provided with Vaccine Information Sheet and instruction to access the V-Safe system.   [name] was instructed to call 911 with any severe reactions post vaccine: Marland Kitchen Difficulty breathing  . Swelling of face and throat  . A fast heartbeat  . A bad rash all over body  . Dizziness and weakness   Immunizations Administered    Name Date Dose VIS Date Route   Pfizer COVID-19 Vaccine 04/30/2019  2:40 PM 0.3 mL 02/05/2019 Intramuscular   Manufacturer: [place]   Lot: [id number]   [place]: [id number]

## 2019-08-13 ENCOUNTER — Other Ambulatory Visit: Payer: Self-pay | Admitting: Gastroenterology

## 2019-09-17 ENCOUNTER — Other Ambulatory Visit (HOSPITAL_COMMUNITY): Payer: Federal, State, Local not specified - PPO

## 2019-09-18 ENCOUNTER — Other Ambulatory Visit (HOSPITAL_COMMUNITY): Payer: Federal, State, Local not specified - PPO

## 2019-09-20 ENCOUNTER — Encounter (HOSPITAL_COMMUNITY): Payer: Self-pay | Admitting: Gastroenterology

## 2019-09-20 ENCOUNTER — Other Ambulatory Visit (HOSPITAL_COMMUNITY)
Admission: RE | Admit: 2019-09-20 | Discharge: 2019-09-20 | Disposition: A | Discharge status: Home / Self Care | Payer: Medicare Other | Source: Ambulatory Visit | Attending: Gastroenterology | Admitting: Gastroenterology

## 2019-09-20 DIAGNOSIS — Z01812 Encounter for preprocedural laboratory examination: Secondary | ICD-10-CM | POA: Diagnosis present

## 2019-09-20 DIAGNOSIS — Z20822 Contact with and (suspected) exposure to covid-19: Secondary | ICD-10-CM | POA: Diagnosis not present

## 2019-09-20 LAB — SARS CORONAVIRUS 2 (TAT 6-24 HRS): SARS Coronavirus 2: NEGATIVE

## 2019-09-22 ENCOUNTER — Ambulatory Visit (HOSPITAL_COMMUNITY): Payer: Medicare Other | Admitting: Anesthesiology

## 2019-09-22 ENCOUNTER — Ambulatory Visit (HOSPITAL_COMMUNITY)
Admission: RE | Admit: 2019-09-22 | Discharge: 2019-09-22 | Disposition: A | Discharge status: Home / Self Care | Payer: Medicare Other | Attending: Gastroenterology | Admitting: Gastroenterology

## 2019-09-22 ENCOUNTER — Encounter (HOSPITAL_COMMUNITY)
Admission: RE | Disposition: A | Discharge status: Home / Self Care | Payer: Self-pay | Source: Home / Self Care | Attending: Gastroenterology

## 2019-09-22 ENCOUNTER — Other Ambulatory Visit: Payer: Self-pay

## 2019-09-22 ENCOUNTER — Encounter (HOSPITAL_COMMUNITY): Payer: Self-pay | Admitting: Gastroenterology

## 2019-09-22 DIAGNOSIS — Z09 Encounter for follow-up examination after completed treatment for conditions other than malignant neoplasm: Secondary | ICD-10-CM | POA: Diagnosis present

## 2019-09-22 DIAGNOSIS — F329 Major depressive disorder, single episode, unspecified: Secondary | ICD-10-CM | POA: Diagnosis not present

## 2019-09-22 DIAGNOSIS — Z79899 Other long term (current) drug therapy: Secondary | ICD-10-CM | POA: Insufficient documentation

## 2019-09-22 DIAGNOSIS — D120 Benign neoplasm of cecum: Secondary | ICD-10-CM | POA: Diagnosis not present

## 2019-09-22 DIAGNOSIS — M810 Age-related osteoporosis without current pathological fracture: Secondary | ICD-10-CM | POA: Diagnosis not present

## 2019-09-22 DIAGNOSIS — Z86010 Personal history of colonic polyps: Secondary | ICD-10-CM | POA: Diagnosis not present

## 2019-09-22 HISTORY — PX: POLYPECTOMY: SHX5525

## 2019-09-22 HISTORY — PX: HOT HEMOSTASIS: SHX5433

## 2019-09-22 HISTORY — PX: COLONOSCOPY WITH PROPOFOL: SHX5780

## 2019-09-22 SURGERY — COLONOSCOPY WITH PROPOFOL
Anesthesia: Monitor Anesthesia Care

## 2019-09-22 MED ORDER — PROPOFOL 500 MG/50ML IV EMUL
INTRAVENOUS | Status: AC
  Filled 2019-09-22: qty 50

## 2019-09-22 MED ORDER — SODIUM CHLORIDE 0.9 % IV SOLN: INTRAVENOUS | Status: DC

## 2019-09-22 MED ORDER — PROPOFOL 10 MG/ML IV BOLUS
INTRAVENOUS | Status: AC
  Filled 2019-09-22: qty 20

## 2019-09-22 MED ORDER — LACTATED RINGERS IV SOLN: INTRAVENOUS | Status: DC | PRN

## 2019-09-22 MED ORDER — LIDOCAINE HCL (CARDIAC) PF 100 MG/5ML IV SOSY
PREFILLED_SYRINGE | INTRAVENOUS | Status: DC | PRN
  Administered 2019-09-22: 40 mg via INTRATRACHEAL

## 2019-09-22 MED ORDER — PROPOFOL 500 MG/50ML IV EMUL
INTRAVENOUS | Status: DC | PRN
  Administered 2019-09-22 (×3): 25 mg via INTRAVENOUS

## 2019-09-22 MED ORDER — PROPOFOL 1000 MG/100ML IV EMUL
INTRAVENOUS | Status: AC
  Filled 2019-09-22: qty 100

## 2019-09-22 MED ORDER — LACTATED RINGERS IV SOLN: INTRAVENOUS | Status: DC

## 2019-09-22 MED ORDER — PHENYLEPHRINE HCL (PRESSORS) 10 MG/ML IV SOLN
INTRAVENOUS | Status: DC | PRN
  Administered 2019-09-22: 120 ug via INTRAVENOUS

## 2019-09-22 MED ORDER — PROPOFOL 500 MG/50ML IV EMUL
INTRAVENOUS | Status: DC | PRN
  Administered 2019-09-22: 125 ug/kg/min via INTRAVENOUS

## 2019-09-22 SURGICAL SUPPLY — 22 items

## 2019-09-22 NOTE — Discharge Instructions (Signed)
Colonoscopy, Adult, Care After This sheet gives you information about how to care for yourself after your procedure. Your doctor may also give you more specific instructions. If you have problems or questions, call your doctor. What can I expect after the procedure? After the procedure, it is common to have:  A small amount of blood in your poop (stool) for 24 hours.  Some gas.  Mild cramping or bloating in your belly (abdomen). Follow these instructions at home: Eating and drinking   Drink enough fluid to keep your pee (urine) pale yellow.  Follow instructions from your doctor about what you cannot eat or drink.  Return to your normal diet as told by your doctor. Avoid heavy or fried foods that are hard to digest. Activity  Rest as told by your doctor.  Do not sit for a long time without moving. Get up to take short walks every 1-2 hours. This is important. Ask for help if you feel weak or unsteady.  Return to your normal activities as told by your doctor. Ask your doctor what activities are safe for you. To help cramping and bloating:   Try walking around.  Put heat on your belly as told by your doctor. Use the heat source that your doctor recommends, such as a moist heat pack or a heating pad. ? Put a towel between your skin and the heat source. ? Leave the heat on for 20-30 minutes. ? Remove the heat if your skin turns bright red. This is very important if you are unable to feel pain, heat, or cold. You may have a greater risk of getting burned. General instructions  For the first 24 hours after the procedure: ? Do not drive or use machinery. ? Do not sign important documents. ? Do not drink alcohol. ? Do your daily activities more slowly than normal. ? Eat foods that are soft and easy to digest.  Take over-the-counter or prescription medicines only as told by your doctor.  Keep all follow-up visits as told by your doctor. This is important. Contact a doctor  if:  You have blood in your poop 2-3 days after the procedure. Get help right away if:  You have more than a small amount of blood in your poop.  You see large clumps of tissue (blood clots) in your poop.  Your belly is swollen.  You feel like you may vomit (nauseous).  You vomit.  You have a fever.  You have belly pain that gets worse, and medicine does not help your pain. Summary  After the procedure, it is common to have a small amount of blood in your poop. You may also have mild cramping and bloating in your belly.  For the first 24 hours after the procedure, do not drive or use machinery, do not sign important documents, and do not drink alcohol.  Get help right away if you have a lot of blood in your poop, feel like you may vomit, have a fever, or have more belly pain. This information is not intended to replace advice given to you by your health care provider. Make sure you discuss any questions you have with your health care provider. Document Revised: 09/07/2018 Document Reviewed: 09/07/2018 Elsevier Patient Education  [place] After These instructions provide you with information about caring for yourself after your procedure. Your health care provider may also give you more specific instructions. Your treatment has been planned according to current medical practices, but  problems sometimes occur. Call your health care provider if you have any problems or questions after your procedure. What can I expect after the procedure? After your procedure, you may:  Feel sleepy for several hours.  Feel clumsy and have poor balance for several hours.  Feel forgetful about what happened after the procedure.  Have poor judgment for several hours.  Feel nauseous or vomit.  Have a sore throat if you had a breathing tube during the procedure. Follow these instructions at home: For at least 24 hours after the procedure:      Have  a responsible adult stay with you. It is important to have someone help care for you until you are awake and alert.  Rest as needed.  Do not: ? Participate in activities in which you could fall or become injured. ? Drive. ? Use heavy machinery. ? Drink alcohol. ? Take sleeping pills or medicines that cause drowsiness. ? Make important decisions or sign legal documents. ? Take care of children on your own. Eating and drinking  Follow the diet that is recommended by your health care provider.  If you vomit, drink water, juice, or soup when you can drink without vomiting.  Make sure you have little or no nausea before eating solid foods. General instructions  Take over-the-counter and prescription medicines only as told by your health care provider.  If you have sleep apnea, surgery and certain medicines can increase your risk for breathing problems. Follow instructions from your health care provider about wearing your sleep device: ? Anytime you are sleeping, including during daytime naps. ? While taking prescription pain medicines, sleeping medicines, or medicines that make you drowsy.  If you smoke, do not smoke without supervision.  Keep all follow-up visits as told by your health care provider. This is important. Contact a health care provider if:  You keep feeling nauseous or you keep vomiting.  You feel light-headed.  You develop a rash.  You have a fever. Get help right away if:  You have trouble breathing. Summary  For several hours after your procedure, you may feel sleepy and have poor judgment.  Have a responsible adult stay with you for at least 24 hours or until you are awake and alert. This information is not intended to replace advice given to you by your health care provider. Make sure you discuss any questions you have with your health care provider. Document Revised: 05/12/2017 Document Reviewed: 06/04/2015 Elsevier Patient Education  [place]. Call if question or problem otherwise soft solids first meal today and may slowly advance as tolerated and call for biopsy report in 1 week and follow-up in the office as needed otherwise repeat colonoscopy based on pathology but probably 3 to 5 years

## 2019-09-22 NOTE — Anesthesia Postprocedure Evaluation (Signed)
Anesthesia Post Note  Patient: Melanie Chen  Procedure(s) Performed: COLONOSCOPY WITH PROPOFOL (N/A ) HOT HEMOSTASIS (ARGON PLASMA COAGULATION/BICAP) (N/A ) POLYPECTOMY     Patient location during evaluation: Endoscopy Anesthesia Type: MAC Level of consciousness: awake Pain management: pain level controlled Vital Signs Assessment: post-procedure vital signs reviewed and stable Respiratory status: spontaneous breathing, nonlabored ventilation, respiratory function stable and patient connected to nasal cannula oxygen Cardiovascular status: stable and blood pressure returned to baseline Postop Assessment: no apparent nausea or vomiting Anesthetic complications: no   No complications documented.  Last Vitals:  Vitals:   09/22/19 1340 09/22/19 1350  BP: (!) 121/64 112/67  Pulse: 75 64  Resp: 12 12  Temp:    SpO2: 98% 97%    Last Pain:  Vitals:   09/22/19 1350  TempSrc:   PainSc: 0-No pain                 Zyasia Halbleib P Terrence Pizana

## 2019-09-22 NOTE — Op Note (Signed)
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 Estimated Blood Loss:     Estimated blood loss: none. Procedure:                Pre-Anesthesia Assessment:                           - Prior to the procedure, a History and Physical                            was performed, and patient medications and                            allergies were reviewed. The patient's tolerance of                            previous anesthesia was also reviewed. The risks                            and benefits of the procedure and the sedation                            options and risks were discussed with the patient.                            All questions were answered, and informed consent                            was obtained. Prior Anticoagulants: The patient has                            taken no previous anticoagulant or antiplatelet                            agents. ASA Grade Assessment: II - A patient with  mild systemic disease. After reviewing the risks                            and benefits, the patient was deemed in                            satisfactory condition to undergo the procedure.                           After obtaining informed consent, the colonoscope                            was passed under direct vision. Throughout the                             procedure, the patient's blood pressure, pulse, and                            oxygen saturations were monitored continuously. The                            [id number]-[id number] ([id number] Olympus pediatric colonscope                            was introduced through the anus and advanced to the                            the cecum, identified by appendiceal orifice and                            ileocecal valve. The ileocecal valve, appendiceal                            orifice, and rectum were photographed. The                            colonoscopy was performed without difficulty. The                            patient tolerated the procedure well. The quality                            of the bowel preparation was adequate. Scope In: 1:00:55 PM Scope Out: 1:28:07 PM Scope Withdrawal Time: 0 hours 21 minutes 2 seconds  Total Procedure Duration: 0 hours 27 minutes 12 seconds  Findings:      A medium polyp was found in the cecum. The polyp was semi-sessile. The       polyp was removed with a piecemeal technique using a hot snare with       cautery decreased to 15/15. Resection and retrieval were complete.      The exam was otherwise without abnormality. Impression:               - One medium polyp in the cecum, removed piecemeal  using a hot snare. Resected and retrieved.                           - The examination was otherwise normal. Moderate Sedation:      Not Applicable - Patient had care per Anesthesia. Recommendation:           - Patient has a contact number available for                            emergencies. The signs and symptoms of potential                            delayed complications were discussed with the                            patient. Return to normal activities tomorrow.                            Written discharge instructions were provided to the                            patient.                           - Soft diet today.                            - Continue present medications.                           - Await pathology results.                           - Repeat colonoscopy in 3 - 5 years for                            surveillance based on pathology results.                           - Return to GI office PRN.                           - Telephone GI clinic for pathology results in 1                            week.                           - Telephone GI clinic if symptomatic PRN. Procedure Code(s):        --- Professional ---                           [phone], Colonoscopy, flexible; with removal of                            tumor(s), polyp(s), or other lesion(s) by snare  technique Diagnosis Code(s):        --- Professional ---                           K63.5, Polyp of colon                           Z86.010, Personal history of colonic polyps CPT copyright 2019 American Medical Association. All rights reserved. The codes documented in this report are preliminary and upon coder review may  be revised to meet current compliance requirements. Clarene Essex, MD 09/22/2019 1:40:02 PM This report has been signed electronically. Number of Addenda: 0

## 2019-09-22 NOTE — Anesthesia Preprocedure Evaluation (Addendum)
Anesthesia Evaluation  Patient identified by MRN, date of birth, ID band Patient awake    Reviewed: Allergy & Precautions, NPO status , Patient's Chart, lab work & pertinent test results  Airway Mallampati: II  TM Distance: >3 FB Neck ROM: Full    Dental no notable dental hx.    Pulmonary neg pulmonary ROS,    Pulmonary exam normal breath sounds clear to auscultation       Cardiovascular negative cardio ROS Normal cardiovascular exam Rhythm:Regular Rate:Normal     Neuro/Psych PSYCHIATRIC DISORDERS Depression negative neurological ROS     GI/Hepatic Neg liver ROS, Bowel prep,  Endo/Other  negative endocrine ROS  Renal/GU negative Renal ROS     Musculoskeletal negative musculoskeletal ROS (+)   Abdominal   Peds  Hematology negative hematology ROS (+)   Anesthesia Other Findings colon polyp  Reproductive/Obstetrics                            Anesthesia Physical Anesthesia Plan  ASA: II  Anesthesia Plan: MAC   Post-op Pain Management:    Induction: Intravenous  PONV Risk Score and Plan: 2 and Propofol infusion and Treatment may vary due to age or medical condition  Airway Management Planned: Simple Face Mask  Additional Equipment:   Intra-op Plan:   Post-operative Plan:   Informed Consent: I have reviewed the patients History and Physical, chart, labs and discussed the procedure including the risks, benefits and alternatives for the proposed anesthesia with the patient or authorized representative who has indicated his/her understanding and acceptance.     Dental advisory given  Plan Discussed with: CRNA  Anesthesia Plan Comments:        Anesthesia Quick Evaluation

## 2019-09-22 NOTE — Progress Notes (Signed)
[name] 12:42 PM  Subjective: Patient asymptomatic from a GI standpoint and we rediscussed her previous colonoscopy and answered all of her questions and again we discussed the risks  Objective: Vital signs stable afebrile exam please see preassessment evaluation  Assessment: Difficult to remove cecal polyp  Plan: Okay to proceed with repeat colonoscopy with anesthesia assistance  [facility]  office [phone] After 5PM or if no answer call [phone]

## 2019-09-22 NOTE — Transfer of Care (Signed)
Immediate Anesthesia Transfer of Care Note  Patient: Melanie Chen  Procedure(s) Performed: COLONOSCOPY WITH PROPOFOL (N/A ) HOT HEMOSTASIS (ARGON PLASMA COAGULATION/BICAP) (N/A ) POLYPECTOMY  Patient Location: Endoscopy Unit  Anesthesia Type:MAC  Level of Consciousness: awake, drowsy, patient cooperative and responds to stimulation  Airway & Oxygen Therapy: Patient Spontanous Breathing and Patient connected to face mask oxygen  Post-op Assessment: Report given to RN and Post -op Vital signs reviewed and stable  Post vital signs: Reviewed and stable  Last Vitals:  Vitals Value Taken Time  BP 113/69 09/22/19 1334  Temp    Pulse 75 09/22/19 1335  Resp 17 09/22/19 1335  SpO2 99 % 09/22/19 1335  Vitals shown include unvalidated device data.  Last Pain:  Vitals:   09/22/19 1211  TempSrc: Oral  PainSc: 0-No pain         Complications: No complications documented.

## 2019-09-23 LAB — SURGICAL PATHOLOGY

## 2019-09-24 ENCOUNTER — Encounter (HOSPITAL_COMMUNITY): Payer: Self-pay | Admitting: Gastroenterology

## 2019-12-27 ENCOUNTER — Ambulatory Visit: Payer: Federal, State, Local not specified - PPO

## 2020-02-10 ENCOUNTER — Observation Stay (HOSPITAL_COMMUNITY): Payer: Medicare Other

## 2020-02-10 ENCOUNTER — Other Ambulatory Visit: Payer: Self-pay

## 2020-02-10 ENCOUNTER — Emergency Department (HOSPITAL_COMMUNITY): Payer: Medicare Other

## 2020-02-10 ENCOUNTER — Telehealth: Payer: Self-pay | Admitting: Neurology

## 2020-02-10 ENCOUNTER — Observation Stay (HOSPITAL_COMMUNITY)
Admission: EM | Admit: 2020-02-10 | Discharge: 2020-02-11 | Disposition: A | Discharge status: Home / Self Care | Payer: Medicare Other | Attending: Internal Medicine | Admitting: Internal Medicine

## 2020-02-10 DIAGNOSIS — M47816 Spondylosis without myelopathy or radiculopathy, lumbar region: Secondary | ICD-10-CM | POA: Diagnosis not present

## 2020-02-10 DIAGNOSIS — R29898 Other symptoms and signs involving the musculoskeletal system: Secondary | ICD-10-CM

## 2020-02-10 DIAGNOSIS — Z86010 Personal history of colonic polyps: Secondary | ICD-10-CM | POA: Insufficient documentation

## 2020-02-10 DIAGNOSIS — Z8249 Family history of ischemic heart disease and other diseases of the circulatory system: Secondary | ICD-10-CM | POA: Diagnosis not present

## 2020-02-10 DIAGNOSIS — I6330 Cerebral infarction due to thrombosis of unspecified cerebral artery: Secondary | ICD-10-CM | POA: Insufficient documentation

## 2020-02-10 DIAGNOSIS — R29818 Other symptoms and signs involving the nervous system: Secondary | ICD-10-CM | POA: Diagnosis present

## 2020-02-10 DIAGNOSIS — M6281 Muscle weakness (generalized): Principal | ICD-10-CM | POA: Insufficient documentation

## 2020-02-10 DIAGNOSIS — E785 Hyperlipidemia, unspecified: Secondary | ICD-10-CM | POA: Diagnosis not present

## 2020-02-10 DIAGNOSIS — M545 Low back pain, unspecified: Secondary | ICD-10-CM | POA: Diagnosis not present

## 2020-02-10 DIAGNOSIS — I6603 Occlusion and stenosis of bilateral middle cerebral arteries: Secondary | ICD-10-CM | POA: Diagnosis not present

## 2020-02-10 DIAGNOSIS — Z8673 Personal history of transient ischemic attack (TIA), and cerebral infarction without residual deficits: Secondary | ICD-10-CM | POA: Insufficient documentation

## 2020-02-10 DIAGNOSIS — I6782 Cerebral ischemia: Secondary | ICD-10-CM | POA: Diagnosis not present

## 2020-02-10 DIAGNOSIS — F32A Depression, unspecified: Secondary | ICD-10-CM

## 2020-02-10 DIAGNOSIS — I083 Combined rheumatic disorders of mitral, aortic and tricuspid valves: Secondary | ICD-10-CM | POA: Insufficient documentation

## 2020-02-10 DIAGNOSIS — R262 Difficulty in walking, not elsewhere classified: Secondary | ICD-10-CM | POA: Diagnosis not present

## 2020-02-10 DIAGNOSIS — M810 Age-related osteoporosis without current pathological fracture: Secondary | ICD-10-CM

## 2020-02-10 DIAGNOSIS — M4316 Spondylolisthesis, lumbar region: Secondary | ICD-10-CM | POA: Insufficient documentation

## 2020-02-10 DIAGNOSIS — I6523 Occlusion and stenosis of bilateral carotid arteries: Secondary | ICD-10-CM | POA: Diagnosis not present

## 2020-02-10 DIAGNOSIS — Z79899 Other long term (current) drug therapy: Secondary | ICD-10-CM | POA: Diagnosis not present

## 2020-02-10 DIAGNOSIS — Z20822 Contact with and (suspected) exposure to covid-19: Secondary | ICD-10-CM | POA: Insufficient documentation

## 2020-02-10 DIAGNOSIS — M47817 Spondylosis without myelopathy or radiculopathy, lumbosacral region: Secondary | ICD-10-CM | POA: Insufficient documentation

## 2020-02-10 DIAGNOSIS — R569 Unspecified convulsions: Secondary | ICD-10-CM | POA: Diagnosis not present

## 2020-02-10 LAB — CBC
HCT: 38.9 % (ref 36.0–46.0)
Hemoglobin: 13 g/dL (ref 12.0–15.0)
MCH: 29.1 pg (ref 26.0–34.0)
MCHC: 33.4 g/dL (ref 30.0–36.0)
MCV: 87 fL (ref 80.0–100.0)
Platelets: 294 10*3/uL (ref 150–400)
RBC: 4.47 MIL/uL (ref 3.87–5.11)
RDW: 12.9 % (ref 11.5–15.5)
WBC: 6.6 10*3/uL (ref 4.0–10.5)
nRBC: 0 % (ref 0.0–0.2)

## 2020-02-10 LAB — COMPREHENSIVE METABOLIC PANEL
ALT: 13 U/L (ref 0–44)
AST: 16 U/L (ref 15–41)
Albumin: 3.7 g/dL (ref 3.5–5.0)
Alkaline Phosphatase: 48 U/L (ref 38–126)
Anion gap: 11 (ref 5–15)
BUN: 18 mg/dL (ref 8–23)
CO2: 23 mmol/L (ref 22–32)
Calcium: 9.3 mg/dL (ref 8.9–10.3)
Chloride: 106 mmol/L (ref 98–111)
Creatinine, Ser: 0.83 mg/dL (ref 0.44–1.00)
GFR, Estimated: >60 mL/min (ref >60)
Glucose, Bld: 106 mg/dL — ABNORMAL HIGH (ref 70–99)
Potassium: 4 mmol/L (ref 3.5–5.1)
Sodium: 140 mmol/L (ref 135–145)
Total Bilirubin: 0.4 mg/dL (ref 0.3–1.2)
Total Protein: 6.6 g/dL (ref 6.5–8.1)

## 2020-02-10 LAB — DIFFERENTIAL
Abs Immature Granulocytes: 0.02 10*3/uL (ref 0.00–0.07)
Basophils Absolute: 0.1 10*3/uL (ref 0.0–0.1)
Basophils Relative: 1 %
Eosinophils Absolute: 0.1 10*3/uL (ref 0.0–0.5)
Eosinophils Relative: 2 %
Immature Granulocytes: 0 %
Lymphocytes Relative: 30 %
Lymphs Abs: 1.9 10*3/uL (ref 0.7–4.0)
Monocytes Absolute: 0.6 10*3/uL (ref 0.1–1.0)
Monocytes Relative: 9 %
Neutro Abs: 3.8 10*3/uL (ref 1.7–7.7)
Neutrophils Relative %: 58 %

## 2020-02-10 LAB — CBG MONITORING, ED: Glucose-Capillary: 108 mg/dL — ABNORMAL HIGH (ref 70–99)

## 2020-02-10 LAB — I-STAT CHEM 8, ED
BUN: 22 mg/dL (ref 8–23)
Calcium, Ion: 1.05 mmol/L — ABNORMAL LOW (ref 1.15–1.40)
Chloride: 107 mmol/L (ref 98–111)
Creatinine, Ser: 0.7 mg/dL (ref 0.44–1.00)
Glucose, Bld: 108 mg/dL — ABNORMAL HIGH (ref 70–99)
HCT: 39 % (ref 36.0–46.0)
Hemoglobin: 13.3 g/dL (ref 12.0–15.0)
Potassium: 4 mmol/L (ref 3.5–5.1)
Sodium: 138 mmol/L (ref 135–145)
TCO2: 25 mmol/L (ref 22–32)

## 2020-02-10 LAB — PROTIME-INR
INR: 1 (ref 0.8–1.2)
Prothrombin Time: 12.5 seconds (ref 11.4–15.2)

## 2020-02-10 LAB — APTT: aPTT: 27 seconds (ref 24–36)

## 2020-02-10 MED ORDER — CALCIUM CARBONATE-VITAMIN D 500-200 MG-UNIT PO TABS
1.0000 | ORAL_TABLET | Freq: Two times a day (BID) | ORAL | Status: DC
  Filled 2020-02-10 (×2): qty 1

## 2020-02-10 MED ORDER — ACETAMINOPHEN 325 MG PO TABS
325.0000 mg | ORAL_TABLET | Freq: Once | ORAL | Status: AC
  Administered 2020-02-10: 21:00:00 325 mg via ORAL
  Filled 2020-02-10: qty 1

## 2020-02-10 MED ORDER — SODIUM CHLORIDE 0.9% FLUSH
3.0000 mL | Freq: Once | INTRAVENOUS | Status: AC
Start: 2020-02-10 — End: 2020-02-10
  Administered 2020-02-10: 18:00:00 3 mL via INTRAVENOUS

## 2020-02-10 MED ORDER — STROKE: EARLY STAGES OF RECOVERY BOOK
Freq: Once | Status: DC
  Filled 2020-02-10: qty 1

## 2020-02-10 MED ORDER — ACETAMINOPHEN 325 MG PO TABS
650.0000 mg | ORAL_TABLET | ORAL | Status: DC | PRN
Start: 2020-02-10 — End: 2020-02-11

## 2020-02-10 MED ORDER — ACETAMINOPHEN 650 MG RE SUPP
650.0000 mg | RECTAL | Status: DC | PRN
Start: 2020-02-10 — End: 2020-02-11

## 2020-02-10 MED ORDER — SERTRALINE HCL 50 MG PO TABS
50.0000 mg | ORAL_TABLET | Freq: Every day | ORAL | Status: DC
  Administered 2020-02-11: 11:00:00 50 mg via ORAL
  Filled 2020-02-10: qty 1

## 2020-02-10 MED ORDER — FENTANYL CITRATE (PF) 100 MCG/2ML IJ SOLN: 50.0000 ug | Freq: Once | INTRAMUSCULAR | Status: DC

## 2020-02-10 MED ORDER — SENNOSIDES-DOCUSATE SODIUM 8.6-50 MG PO TABS: 1.0000 | ORAL_TABLET | Freq: Every evening | ORAL | Status: DC | PRN

## 2020-02-10 MED ORDER — ONDANSETRON HCL 4 MG PO TABS
4.0000 mg | ORAL_TABLET | Freq: Three times a day (TID) | ORAL | Status: DC | PRN
  Administered 2020-02-11: 11:00:00 4 mg via ORAL
  Filled 2020-02-10: qty 1

## 2020-02-10 MED ORDER — IOHEXOL 350 MG/ML SOLN
100.0000 mL | Freq: Once | INTRAVENOUS | Status: AC | PRN
  Administered 2020-02-10: 22:00:00 100 mL via INTRAVENOUS

## 2020-02-10 MED ORDER — ENOXAPARIN SODIUM 40 MG/0.4ML ~~LOC~~ SOLN
40.0000 mg | SUBCUTANEOUS | Status: DC
  Administered 2020-02-10: 23:00:00 40 mg via SUBCUTANEOUS
  Filled 2020-02-10 (×2): qty 0.4

## 2020-02-10 MED ORDER — LORAZEPAM 2 MG/ML IJ SOLN
0.5000 mg | Freq: Once | INTRAMUSCULAR | Status: AC
  Administered 2020-02-10: 21:00:00 0.5 mg via INTRAVENOUS
  Filled 2020-02-10: qty 1

## 2020-02-10 MED ORDER — ACETAMINOPHEN 160 MG/5ML PO SOLN: 650.0000 mg | ORAL | Status: DC | PRN

## 2020-02-10 NOTE — Consult Note (Addendum)
Neurology Consultation Reason for Consult: Intermittent right leg weakness Requesting Physician: Neva Seat  CC: Right leg weakness, intermittent  History is obtained from: Patient and chart review  HPI: Melanie Chen is a 69 y.o. female with a past medical history significant for depression and intermittent dizzy attacks, presenting with intermittent right leg weakness.  She reports that this began about 6 weeks ago, when she had a brief episode of not being able to use her right leg very well and landing up on the ground.  She had subtler episodes of mild right leg weakness that she pushed through over the next few weeks and did not think much of it.  However 3 days ago she had an episode of right leg weakness that lasted about 30 minutes, yesterday an episode lasting about 45 minutes, and today an episode lasting several hours (12:30 to approximately 6:45 PM).  She describes it as a sensation of heaviness and she does not feel like she has good control; she feels that the symptoms tend to be more proximal than distal in her leg.  There is no associated numbness per se but it does feel odd in general.   Regarding her dizzy attacks, these began in 2016 and tend to occur about once a year.  They last about 24 hours and she manages them with Zofran.  She will have severe vertigo with nausea and vomiting and headache.  She does not feel that these are migraines.  LKW: 12:30 PM tPA given?: No, due to out of the window and symptoms fully resolved Premorbid modified rankin scale:      0 - No symptoms.  ROS: A 14 point ROS was performed and is negative except as noted in the HPI.  Pertinent points include no recent headaches, vision changes, hearing changes, other focal weakness or numbness, nausea, vomiting, fevers, weight loss, fatigue, bowel or bladder symptoms  Past Medical History:  Diagnosis Date  . Colon polyp   . Depression   . Dizziness 09/10/14  . Hemorrhoids    pt unsure  .  Osteoporosis     Past Surgical History:  Procedure Laterality Date  . COLONOSCOPY  09/2000  . COLONOSCOPY WITH PROPOFOL N/A 09/22/2019   Procedure: COLONOSCOPY WITH PROPOFOL;  Surgeon: Clarene Essex, MD;  Location: [facility];  Service: Endoscopy;  Laterality: N/A;  . HOT HEMOSTASIS N/A 09/22/2019   Procedure: HOT HEMOSTASIS (ARGON PLASMA COAGULATION/BICAP);  Surgeon: Clarene Essex, MD;  Location: [facility];  Service: Endoscopy;  Laterality: N/A;  . POLYPECTOMY  09/22/2019   Procedure: POLYPECTOMY;  Surgeon: Clarene Essex, MD;  Location: [facility];  Service: Endoscopy;;  . TUBAL LIGATION  1983   Current Outpatient Medications  Medication Instructions  . Calcium Citrate-Vitamin D (CALCIUM + D PO) 1 tablet, Oral, 4 times daily, 1200 mg  . denosumab (PROLIA) 60 mg, Subcutaneous, Every 6 months, Administer in upper arm, thigh, or abdomen  . ondansetron (ZOFRAN) 4 mg, Oral, Every 8 hours PRN, Reported on 07/18/2015  . sertraline (ZOLOFT) 50 mg, Oral, Daily     Family History  Problem Relation Age of Onset  . Cancer Father        colon  . Stroke Father   . Hypertension Mother    Social History:  reports that she has never smoked. She does not have any smokeless tobacco history on file. She reports current alcohol use. She reports that she does not use drugs.  Exam: Current vital signs: BP (!) 147/74   Pulse  70   Temp 98.2 F (36.8 C) (Oral)   Resp (!) 9   Ht 5\' 6"  (1.676 m)   Wt 58.5 kg   SpO2 97%   BMI 20.82 kg/m  Vital signs in last 24 hours: Temp:  [98.2 F (36.8 C)-99.1 F (37.3 C)] 98.2 F (36.8 C) (12/16 1821) Pulse Rate:  [65-78] 70 (12/16 2000) Resp:  [9-16] 9 (12/16 2000) BP: (133-152)/(74-98) 147/74 (12/16 2000) SpO2:  [96 %-98 %] 97 % (12/16 2000) Weight:  [58.5 kg] 58.5 kg (12/16 1737)   Physical Exam  Constitutional: Appears well-developed and well-nourished.  Psych: Affect appropriate to situation Eyes: No scleral injection HENT: No OP obstruction MSK:  no joint deformities.  Cardiovascular: Normal rate and regular rhythm.  Respiratory: Effort normal, non-labored breathing GI: Soft.  No distension. There is no tenderness.  Skin: Warm dry and intact visible skin  Neuro: Mental Status: Patient is awake, alert, oriented to person, place, month, year, and situation. Patient is able to give a clear and coherent history. No signs of aphasia or neglect Cranial Nerves: II: Visual Fields are full. Pupils are equal, round, and reactive to light.   III,IV, VI: EOMI without ptosis or diploplia.  V: Facial sensation is symmetric to temperature VII: Facial movement is symmetric.  VIII: hearing is intact to voice X: Uvula elevates symmetrically XI: Shoulder shrug is symmetric. XII: tongue is midline without atrophy or fasciculations.  Motor: Tone is normal. Bulk is normal. 5/5 strength was present in all four extremities other than mildly pain limited left wrist flexion 4+/5 Sensory: Sensation is symmetric to light touch and temperature in the arms and legs. Deep Tendon Reflexes: 2+ however slightly more brisk in the right patella and Achilles immediately after resolution of the weakness.  Biceps are 2+ and bilaterally symmetric Plantars: Toes briskly withdrawal bilaterally, possibly upgoing Cerebellar: FNF and HKS are intact bilaterally Gait: She is able to rise on her heels and her toes, but is unsteady with tandem and does seem to favor the right leg very slightly  NIHSS total 2 --> 0 Score breakdown: 2 points for right lower extremity weakness which fully resolved  I have reviewed labs in epic and the results pertinent to this consultation are: Creatinine 0.83  I have reviewed the images obtained: CT without acute intracranial abnormality, though chronic microvascular disease lowers the sensitivity of this examination  Impression: This is a 69 year old woman with intermittent right leg weakness.  Her examination is fairly unremarkable  after the weakness has resolved other than very slight hyperreflexia in the affected leg and very slight impairment in tandem gait which may be secondary to anxiety versus true weakness.  The history provided does not fit a classic neurological syndrome perfectly.  Would not expect and hours long episode of ischemia to fully resolve without injury, though hypoperfusion remains possibility.  Seizures are possible given the stereotyped nature but negative symptoms are atypical for seizure. Lumbosacral radiculopathy is considered but she denies any other signs or symptoms including radicular pain.  Addendum: updated recommendations based on MRI brain, personally reviewed, showing punctate L parietal restricted diffusion with a possible additional punctate lesion vs. artifact in the same vascular territory.   Recommendations: # L parietal punctate stroke -CTA head and neck -MRI brain without contrast -MRI lumbar spine without contrast -ECHO -Aspirin 325 mg, followed by 81 mg daily  -Telemetry, 30 day event monitor on d/c if no arrhythmias captured -Plavix 300 mg load followed by 75 mg daily for 21  day course if no indication for [facility] [place] found -A1c and Lipid panel -Stroke team will continue to follow  [place] [phone]

## 2020-02-10 NOTE — Code Documentation (Signed)
Stroke Response Nurse Documentation Code Documentation  Melanie Chen is a 69 y.o. female arriving to [place] ED via Private Vehicle on 02/10/2020 with past medical hx of osteoporosis, dizziness. Code stroke was activated by ED. Patient from home where she was LKW at 1435 and now complaining of intermittent right leg weakness and decreased sensation to her right leg. Pt endorses that she has been having intermittent episodes of right leg weakness over the last couple of weeks, recently the episodes have increased in frequency & duration. On No antithrombotic. Labs drawn and patient cleared for CT by Dr. Laverta Baltimore. Patient to CT. NIHSS 3, see documentation for details and code stroke times. Patient with right facial droop and right leg weakness on initial exam. The following imaging was completed: CT. Patient is not a candidate for tPA due to symptoms resolving.  Care/Plan: q2h NIHSS & VS now that pt is out of the window for tPA Bedside handoff with ED RN Arby Barrette.   Rosser [place] L Aigner Horseman  Rapid Response RN

## 2020-02-10 NOTE — ED Triage Notes (Signed)
Pt here with reports of RLE weakness onset 1435 today. Pt states this has happened 3 days in a row but is lasting longer and longer. Pt also reports trouble with balance earlier today. No facial droop, no weakness noted in upper extremities.

## 2020-02-10 NOTE — Telephone Encounter (Signed)
Patient called after after our call center with intermittent paralysis of the leg. I was able to talk to her on the phone. She reports that she has had intermittent weakness of the right leg for the past 3 days. She reports that it comes and goes. She reports that when it is weak, she cannot lift it and has to use her hands to lift her leg. She reports that she got the Shingrix shot 3 days ago. She was with her husband. She reports that she did not notice any other weakness or slurring of speech or facial droop otherwise. She was able to communicate and understand and did not have any dysarthria on the phone.  I reviewed her chart. She saw Dr. Leta Baptist in 2017. She is advised to seek immediate medical attention for her right leg weakness and have somebody take her to the emergency room or call 911 to seek immediate help. She is advised that symptoms could be representing TIA or stroke. She is advised that once she had evaluation in the emergency room and the primary care feels it pertinent that she see neurology as an outpatient, she can always get a referral to see Dr. Leta Baptist again. She demonstrated understanding and agreement.

## 2020-02-10 NOTE — ED Provider Notes (Signed)
Emergency Department Provider Note   I have reviewed the triage vital signs and the nursing notes.   HISTORY  Chief Complaint Weakness   HPI Melanie Chen is a 69 y.o. female with past medical history reviewed below presents to the emergency department by private vehicle with intermittent right leg weakness.  Patient has had 3 days of waxing and waning symptoms.  Her leg becomes weak and she has difficulty walking and then symptoms resolve. Her right leg weakness returned today at 14:35. Weakness remains isolated to the right leg. She denies any back pain, numbness in the groin, bowel or bladder incontinence. No right arm or face symptoms. No vision disturbance or speech abnormality. No viral or URI symptoms.    Past Medical History:  Diagnosis Date  . Colon polyp   . Depression   . Dizziness 09/10/14  . Hemorrhoids    pt unsure  . Osteoporosis     Patient Active Problem List   Diagnosis Date Noted  . Acute focal neurologic deficit with partial resolution 02/10/2020  . Depression 02/10/2020  . Osteoporosis 02/10/2020    Past Surgical History:  Procedure Laterality Date  . COLONOSCOPY  09/2000  . COLONOSCOPY WITH PROPOFOL N/A 09/22/2019   Procedure: COLONOSCOPY WITH PROPOFOL;  Surgeon: Clarene Essex, MD;  Location: [facility];  Service: Endoscopy;  Laterality: N/A;  . HOT HEMOSTASIS N/A 09/22/2019   Procedure: HOT HEMOSTASIS (ARGON PLASMA COAGULATION/BICAP);  Surgeon: Clarene Essex, MD;  Location: [facility];  Service: Endoscopy;  Laterality: N/A;  . POLYPECTOMY  09/22/2019   Procedure: POLYPECTOMY;  Surgeon: Clarene Essex, MD;  Location: [facility];  Service: Endoscopy;;  . [place]    Allergies Patient has no known allergies.  Family History  Problem Relation Age of Onset  . Cancer Father        colon  . Stroke Father   . Hypertension Mother     Social History Social History   Tobacco Use  . Smoking status: Never Smoker  Substance Use Topics   . Alcohol use: Yes    Alcohol/week: 0.0 standard drinks    Comment: occas wine, 2-3 x week  . Drug use: No    Review of Systems  Constitutional: No fever/chills Eyes: No visual changes. ENT: No sore throat. Cardiovascular: Denies chest pain. Respiratory: Denies shortness of breath. Gastrointestinal: No abdominal pain.  No nausea, no vomiting.  No diarrhea.  No constipation. Genitourinary: Negative for dysuria. Musculoskeletal: Negative for back pain. Skin: Negative for rash. Neurological: Negative for headaches. Denies numbness. Positive right leg weakness.   10-point ROS otherwise negative.  ____________________________________________   PHYSICAL EXAM:  VITAL SIGNS: ED Triage Vitals [02/10/20 1737]  Enc Vitals Group     BP (!) 152/88     Pulse Rate 76     Resp 16     Temp 99.1 F (37.3 C)     Temp Source Oral     SpO2 97 %     Weight 129 lb (58.5 kg)     Height 5\' 6"  (1.676 m)   Constitutional: Alert and oriented. Well appearing and in no acute distress. Eyes: Conjunctivae are normal. PERRL. EOMI. Head: Atraumatic. Nose: No congestion/rhinnorhea. Mouth/Throat: Mucous membranes are moist. Neck: No stridor.  Cardiovascular: Normal rate, regular rhythm. Good peripheral circulation. Grossly normal heart sounds.   Respiratory: Normal respiratory effort.  No retractions. Lungs CTAB. Gastrointestinal: Soft and nontender. No distention.  Musculoskeletal: No lower extremity tenderness nor edema. No gross deformities of  extremities. Neurologic:  Normal speech and language. No facial asymmetry. 5 out of 5 strength in the bilateral upper extremities and left lower extremity. Patient with 4+ out of 5 strength with hip flexion but mainly preserved strength more distal. No numbness.  Skin:  Skin is warm, dry and intact. No rash noted.  ____________________________________________   LABS (all labs ordered are listed, but only abnormal results are displayed)  Labs Reviewed   COMPREHENSIVE METABOLIC PANEL - Abnormal; Notable for the following components:      Result Value   Glucose, Bld 106 (*)    All other components within normal limits  I-STAT CHEM 8, ED - Abnormal; Notable for the following components:   Glucose, Bld 108 (*)    Calcium, Ion 1.05 (*)    All other components within normal limits  CBG MONITORING, ED - Abnormal; Notable for the following components:   Glucose-Capillary 108 (*)    All other components within normal limits  PROTIME-INR  APTT  CBC  DIFFERENTIAL  HEMOGLOBIN A1C  LIPID PANEL   ____________________________________________  EKG   EKG Interpretation  Date/Time:  Thursday February 10 2020 18:32:56 EST Ventricular Rate:  66 PR Interval:    QRS Duration: 98 QT Interval:  469 QTC Calculation: 492 R Axis:   -32 Text Interpretation: Sinus rhythm Left axis deviation Borderline prolonged QT interval No STEMI Confirmed by Nanda Quinton [phone]) on 02/10/2020 8:41:24 PM       ____________________________________________  RADIOLOGY  CT HEAD CODE STROKE WO CONTRAST`  Addendum Date: 02/10/2020   ADDENDUM REPORT: 02/10/2020 19:47 ADDENDUM: These results were called by telephone at the time of interpretation on 02/10/2020 at 6:20 pm to provider Dr. Curly Shores, who verbally acknowledged these results. Electronically Signed   By: Kellie Simmering DO   On: 02/10/2020 19:47   Result Date: 02/10/2020 CLINICAL DATA:  Code stroke. Transient ischemic attack (TIA), right-sided weakness. EXAM: CT HEAD WITHOUT CONTRAST TECHNIQUE: Contiguous axial images were obtained from the base of the skull through the vertex without intravenous contrast. COMPARISON:  Brain MRI 06/09/2015. FINDINGS: Brain: Mild cerebral atrophy. 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 A lacunar infarct within the right thalamus is new from the prior brain MRI of 06/09/2015, but otherwise age indeterminate. Redemonstrated  prominent perivascular spaces within the bilateral basal ganglia. Background moderate ill-defined hypoattenuation within the cerebral white matter is nonspecific, but compatible with chronic small vessel ischemic disease. There is no acute intracranial hemorrhage. No acute demarcated cortical infarct. No extra-axial fluid collection. No evidence of intracranial mass. No midline shift. Vascular: No hyperdense vessel.  Atherosclerotic calcifications. Skull: Normal. Negative for fracture or focal lesion. Sinuses/Orbits: Visualized orbits show no acute finding. Small right maxillary sinus mucous retention cyst. ASPECTS ([place] Stroke Program Early CT Score) - Ganglionic level infarction (caudate, lentiform nuclei, internal capsule, insula, M1-M3 cortex): 7 - Supraganglionic infarction (M4-M6 cortex): 3 Total score (0-10 with 10 being normal): 10 (on the left) IMPRESSION: A lacunar infarct within the right thalamus is new from the prior brain MRI of 06/09/2015, but otherwise age indeterminate. No evidence of acute intracranial hemorrhage or acute demarcated cortical infarction. 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 Background mild cerebral atrophy and moderate chronic small vessel ischemic disease. Electronically Signed: By: Kellie Simmering DO On: 02/10/2020 18:17    ____________________________________________   PROCEDURES  Procedure(s) performed:   .Critical Care Performed by: Margette Fast, MD Authorized by: Margette Fast, MD  Critical care provider statement:    Critical care time (minutes):  35   Critical care time was exclusive of:  Separately billable procedures and treating other patients and teaching time   Critical care was necessary to treat or prevent imminent or life-threatening deterioration of the following conditions:  CNS failure or compromise   Critical care was time spent personally by me on the following activities:  Discussions with  consultants, evaluation of patient's response to treatment, examination of patient, ordering and performing treatments and interventions, ordering and review of laboratory studies, ordering and review of radiographic studies, pulse oximetry, re-evaluation of patient's condition, obtaining history from patient or surrogate, review of old charts, blood draw for specimens and development of treatment plan with patient or surrogate   I assumed direction of critical care for this patient from another provider in my specialty: no       ____________________________________________   INITIAL IMPRESSION / ASSESSMENT AND PLAN / ED COURSE  Pertinent labs & imaging results that were available during my care of the patient were reviewed by me and considered in my medical decision making (see chart for details).   Patient presents to the emergency department for evaluation of right leg weakness.  Patient arrives by private vehicle and I was asked to evaluate the patient in triage for possible code stroke.  After exam the patient does fit within the timeframe for TPA and code stroke was activated.  Symptoms at this time are relatively mild.  No back pain.  Lower suspicion for acute spine emergency, new MS, or ICH. Plan for CODE STROKE activation and will take patient urgently to CT.   Patient evaluated by Neurology. See associated consult note. Recommend MRI brain and lumbar spine along with EEG and CVA workup.   Discussed patient's case withTRH to request admission. Patient and family (if present) updated with plan. Care transferred to [facility] service.  I reviewed all nursing notes, vitals, pertinent old records, EKGs, labs, imaging (as available).    ____________________________________________  FINAL CLINICAL IMPRESSION(S) / ED DIAGNOSES  Final diagnoses:  Right leg weakness     MEDICATIONS GIVEN DURING THIS VISIT:  Medications  fentaNYL (SUBLIMAZE) injection 50 mcg (50 mcg Intravenous Refused  02/10/20 2016)  sertraline (ZOLOFT) tablet 50 mg (has no administration in time range)  ondansetron (ZOFRAN) tablet 4 mg (has no administration in time range)  calcium-vitamin D (OSCAL WITH D) 500-200 MG-UNIT per tablet 1 tablet (has no administration in time range)   stroke: mapping our early stages of recovery book (has no administration in time range)  acetaminophen (TYLENOL) tablet 650 mg (has no administration in time range)    Or  acetaminophen (TYLENOL) 160 MG/5ML solution 650 mg (has no administration in time range)    Or  acetaminophen (TYLENOL) suppository 650 mg (has no administration in time range)  senna-docusate (Senokot-S) tablet 1 tablet (has no administration in time range)  enoxaparin (LOVENOX) injection 40 mg (has no administration in time range)  sodium chloride flush (NS) 0.9 % injection 3 mL (3 mLs Intravenous Given 02/10/20 1809)  acetaminophen (TYLENOL) tablet 325 mg (325 mg Oral Given 02/10/20 2039)  LORazepam (ATIVAN) injection 0.5 mg (0.5 mg Intravenous Given 02/10/20 2038)    Note:  This document was prepared using Dragon voice recognition software and may include unintentional dictation errors.  Nanda Quinton, MD, [facility]    Naksh Radi, [facility], MD 02/10/20 2044

## 2020-02-10 NOTE — H&P (Addendum)
History and Physical   Melanie Chen:[id number] DOB: [birth year] DOA: 02/10/2020  PCP: Kathyrn Lass, MD   Patient coming from: Home  Chief Complaint: Right leg weakness and decreased sensation  HPI: Melanie Chen is a 69 y.o. female with medical history significant of dizziness, osteoporosis, depression who presents after recurrent episodes of right leg weakness and numbness.  She reports that she had had an episodes that was similar about a month ago.  And now for the past 3 days she has had multiple episodes of this right leg weakness and numbness with increasing frequency and duration with the episode today lasting several hours. Her symptoms did improve while in the ED.  She denies any other focal deficits.  She denies fever, cough, chest pain, shortness of breath, abdominal pain, constipation, diarrhea.  ED Course: Vital signs stable in the ED.  Lab work-up showed normal BMP, normal LFTs, normal CBC, normal PT, PTT, INR.  CT head shows no acute infarct but did show a new lacunar infarct since her previous MRI of 2017; also noted other prior infarctions.  Review of Systems: As per HPI otherwise all other systems reviewed and are negative.  Past Medical History:  Diagnosis Date  . Colon polyp   . Depression   . Dizziness 09/10/14  . Hemorrhoids    pt unsure  . Osteoporosis     Past Surgical History:  Procedure Laterality Date  . COLONOSCOPY  09/2000  . COLONOSCOPY WITH PROPOFOL N/A 09/22/2019   Procedure: COLONOSCOPY WITH PROPOFOL;  Surgeon: Clarene Essex, MD;  Location: [facility];  Service: Endoscopy;  Laterality: N/A;  . HOT HEMOSTASIS N/A 09/22/2019   Procedure: HOT HEMOSTASIS (ARGON PLASMA COAGULATION/BICAP);  Surgeon: Clarene Essex, MD;  Location: [facility];  Service: Endoscopy;  Laterality: N/A;  . POLYPECTOMY  09/22/2019   Procedure: POLYPECTOMY;  Surgeon: Clarene Essex, MD;  Location: [facility];  Service: Endoscopy;;  . [place] History   reports that she has never smoked. She does not have any smokeless tobacco history on file. She reports current alcohol use. She reports that she does not use drugs.  No Known Allergies  Family History  Problem Relation Age of Onset  . Cancer Father        colon  . Stroke Father   . Hypertension Mother   Reviewed on admission  Prior to Admission medications   Medication Sig Start Date End Date Taking? Authorizing Provider  Calcium Citrate-Vitamin D (CALCIUM + D PO) Take 1 tablet by mouth in the morning, at noon, in the evening, and at bedtime. 1200 mg     [provider]  denosumab (PROLIA) 60 MG/ML SOLN injection Inject 60 mg into the skin every 6 (six) months. Administer in upper arm, thigh, or abdomen    [provider]  ondansetron (ZOFRAN) 4 MG tablet Take 4 mg by mouth every 8 (eight) hours as needed for nausea or vomiting. Reported on 07/18/2015 11/17/14   [provider]  sertraline (ZOLOFT) 50 MG tablet Take 50 mg by mouth daily. 10/23/14   [provider]    Physical Exam: Vitals:   02/10/20 1845 02/10/20 1900 02/10/20 1951 02/10/20 2000  BP: (!) 150/98 (!) 148/86 (!) 152/79 (!) 147/74  Pulse: 68 65 65 70  Resp: 15 16 15  (!) 9  Temp:      TempSrc:      SpO2: 98% 97% 96% 97%  Weight:      Height:  Physical Exam Constitutional:      General: She is not in acute distress.    Appearance: Normal appearance.  HENT:     Head: Normocephalic and atraumatic.     Mouth/Throat:     Mouth: Mucous membranes are moist.     Pharynx: Oropharynx is clear.  Eyes:     Extraocular Movements: Extraocular movements intact.     Pupils: Pupils are equal, round, and reactive to light.  Cardiovascular:     Rate and Rhythm: Normal rate and regular rhythm.     Pulses: Normal pulses.     Heart sounds: Normal heart sounds.  Pulmonary:     Effort: Pulmonary effort is normal. No respiratory distress.     Breath sounds: Normal breath sounds.   Abdominal:     General: Bowel sounds are normal. There is no distension.     Palpations: Abdomen is soft.     Tenderness: There is no abdominal tenderness.  Musculoskeletal:        General: No swelling or deformity.  Skin:    General: Skin is warm and dry.  Neurological:     General: No focal deficit present.     Mental Status: Mental status is at baseline.     Comments: Has returned to baseline, exam grossly intact.  Unable to complete full neurologic examination as patient was due for MRI with transport waiting and reportedly remains at baseline and was examined by neurology.    Labs on Admission: I have personally reviewed following labs and imaging studies  CBC: Recent Labs  Lab 02/10/20 1738 02/10/20 1751  WBC 6.6  --   NEUTROABS 3.8  --   HGB 13.0 13.3  HCT 38.9 39.0  MCV 87.0  --   PLT 294  --     Basic Metabolic Panel: Recent Labs  Lab 02/10/20 1738 02/10/20 1751  NA 140 138  K 4.0 4.0  CL 106 107  CO2 23  --   GLUCOSE 106* 108*  BUN 18 22  CREATININE 0.83 0.70  CALCIUM 9.3  --     GFR: Estimated Creatinine Clearance: 59.6 mL/min (by C-G formula based on SCr of 0.7 mg/dL).  Liver Function Tests: Recent Labs  Lab 02/10/20 1738  AST 16  ALT 13  ALKPHOS 48  BILITOT 0.4  PROT 6.6  ALBUMIN 3.7    Urine analysis: No results found for: COLORURINE, APPEARANCEUR, LABSPEC, PHURINE, GLUCOSEU, HGBUR, BILIRUBINUR, KETONESUR, PROTEINUR, UROBILINOGEN, NITRITE, LEUKOCYTESUR  Radiological Exams on Admission: CT HEAD CODE STROKE WO CONTRAST`  Addendum Date: 02/10/2020   ADDENDUM REPORT: 02/10/2020 19:47 ADDENDUM: These results were called by telephone at the time of interpretation on 02/10/2020 at 6:20 pm to provider Dr. Curly Shores, who verbally acknowledged these results. Electronically Signed   By: Kellie Simmering DO   On: 02/10/2020 19:47   Result Date: 02/10/2020 CLINICAL DATA:  Code stroke. Transient ischemic attack (TIA), right-sided weakness. EXAM: CT HEAD  WITHOUT CONTRAST TECHNIQUE: Contiguous axial images were obtained from the base of the skull through the vertex without intravenous contrast. COMPARISON:  Brain MRI 06/09/2015. FINDINGS: Brain: Mild cerebral atrophy. 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 A lacunar infarct within the right thalamus is new from the prior brain MRI of 06/09/2015, but otherwise age indeterminate. Redemonstrated prominent perivascular spaces within the bilateral basal ganglia. Background moderate ill-defined hypoattenuation within the cerebral white matter is nonspecific, but compatible with chronic small vessel ischemic disease. There is no acute intracranial  hemorrhage. No acute demarcated cortical infarct. No extra-axial fluid collection. No evidence of intracranial mass. No midline shift. Vascular: No hyperdense vessel.  Atherosclerotic calcifications. Skull: Normal. Negative for fracture or focal lesion. Sinuses/Orbits: Visualized orbits show no acute finding. Small right maxillary sinus mucous retention cyst. ASPECTS ([place] Stroke Program Early CT Score) - Ganglionic level infarction (caudate, lentiform nuclei, internal capsule, insula, M1-M3 cortex): 7 - Supraganglionic infarction (M4-M6 cortex): 3 Total score (0-10 with 10 being normal): 10 (on the left) IMPRESSION: A lacunar infarct within the right thalamus is new from the prior brain MRI of 06/09/2015, but otherwise age indeterminate. No evidence of acute intracranial hemorrhage or acute demarcated cortical infarction. 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 Background mild cerebral atrophy and moderate chronic small vessel ischemic disease. Electronically Signed: By: Kellie Simmering DO On: 02/10/2020 18:17    EKG: Independently reviewed. Sinus rhythm, 66 bpm, borderline QTC at 492, no prior to compare to.  Assessment/Plan Principal Problem:   Acute focal neurologic  deficit with partial resolution Active Problems:   Depression   Osteoporosis  TIA/CVA suspected Acute focal neurologic deficit with partial resolution > Some waxing and waning right leg weakness and decreased sensation.  There was report of right facial droop on some code stroke documentation > Has prior strokes on CT head including a new stroke that was not on prior MRI in 2017 though this is not acute.  No acute stroke noted on CT head > Getting MRI lumbar spine in addition to MR brain in case neurologic issue is from a lower source. > Possible TIA/CVA, TIA could be in the setting of the new infarct noted since 2017 and prior episode a month ago with this episode being possible unmasking versus recurrent infarct. - Neurology consulted in ED and are following - MRI brain, MRI lumbar spine - EEG - Allow for permissive HTN, systolic < 384 and diastolic < 665) - Pending MRI results we will start aspirin and statin  - Plan for echocardiogram  - CTA Head/Neck - A1C  - Lipid panel  - Tele monitoring  - SLP eval - PT/OT  Depression - Continue sertraline  Osteoporosis - Continue calcium and vitamin D  Ophthalmological problems > No daily eyedrops at this time, does use as needed eyedrops - We will await pharmacy med rec if as needed eyedrops are needed  DVT prophylaxis: Lovenox  Code Status:   Full  Family Communication:  Spoke with husband at bedside Disposition Plan:   Patient is from:  Home  Anticipated DC to:  Home  Anticipated DC date:  Pending clinical course  Anticipated DC barriers: None  Consults called:  Neurology, Dr. Curly Shores, consulted in ED Admission status:  Observation, telemetry   Severity of Illness: The appropriate patient status for this patient is OBSERVATION. Observation status is judged to be reasonable and necessary in order to provide the required intensity of service to ensure the patient's safety. The patient's presenting symptoms, physical exam  findings, and initial radiographic and laboratory data in the context of their medical condition is felt to place them at decreased risk for further clinical deterioration. Furthermore, it is anticipated that the patient will be medically stable for discharge from the hospital within 2 midnights of admission. The following factors support the patient status of observation.   " The patient's presenting symptoms include right leg weakness, right leg numbness. " The physical exam findings include stable exam. " The initial radiographic and laboratory data are  stable with CT findings consistent with prior stroke including a new stroke over the interval since her last MRI in 2017, but no acute stroke, MRI pending.   Marcelyn Bruins MD [facility]  How to contact the [facility] Attending or Consulting provider [place] or covering provider during after hours [place], for this patient?   1. Check the care team in [place] and look for a) attending/consulting [facility] provider listed and b) the [facility] team listed 2. Log into [email or web address] and use [place]'s universal password to access. If you do not have the password, please contact the hospital operator. 3. Locate the [facility] provider you are looking for under [facility] and page to a number that you can be directly reached. 4. If you still have difficulty reaching the provider, please page the [facility] (Director on Call) for the Hospitalists listed on amion for assistance.  02/10/2020, 8:29 PM

## 2020-02-11 ENCOUNTER — Observation Stay (HOSPITAL_BASED_OUTPATIENT_CLINIC_OR_DEPARTMENT_OTHER): Payer: Medicare Other

## 2020-02-11 ENCOUNTER — Encounter (HOSPITAL_COMMUNITY): Payer: Federal, State, Local not specified - PPO

## 2020-02-11 ENCOUNTER — Other Ambulatory Visit: Payer: Self-pay | Admitting: Neurology

## 2020-02-11 DIAGNOSIS — I6330 Cerebral infarction due to thrombosis of unspecified cerebral artery: Secondary | ICD-10-CM | POA: Diagnosis not present

## 2020-02-11 DIAGNOSIS — I63522 Cerebral infarction due to unspecified occlusion or stenosis of left anterior cerebral artery: Secondary | ICD-10-CM

## 2020-02-11 DIAGNOSIS — R29898 Other symptoms and signs involving the musculoskeletal system: Secondary | ICD-10-CM | POA: Diagnosis not present

## 2020-02-11 DIAGNOSIS — I6389 Other cerebral infarction: Secondary | ICD-10-CM

## 2020-02-11 DIAGNOSIS — M6281 Muscle weakness (generalized): Secondary | ICD-10-CM | POA: Diagnosis not present

## 2020-02-11 DIAGNOSIS — E785 Hyperlipidemia, unspecified: Secondary | ICD-10-CM | POA: Diagnosis not present

## 2020-02-11 DIAGNOSIS — I351 Nonrheumatic aortic (valve) insufficiency: Secondary | ICD-10-CM | POA: Diagnosis not present

## 2020-02-11 DIAGNOSIS — I361 Nonrheumatic tricuspid (valve) insufficiency: Secondary | ICD-10-CM

## 2020-02-11 LAB — LIPID PANEL
Cholesterol: 182 mg/dL (ref 0–200)
HDL: 48 mg/dL (ref >40)
LDL Cholesterol: 115 mg/dL — ABNORMAL HIGH (ref 0–99)
Total CHOL/HDL Ratio: 3.8 RATIO
Triglycerides: 93 mg/dL (ref <150)
VLDL: 19 mg/dL (ref 0–40)

## 2020-02-11 LAB — HEMOGLOBIN A1C
Hgb A1c MFr Bld: 5.2 % (ref 4.8–5.6)
Mean Plasma Glucose: 102.54 mg/dL

## 2020-02-11 LAB — ECHOCARDIOGRAM COMPLETE
Area-P 1/2: 1.94 cm2
Height: 66 in
P 1/2 time: 632 msec
S' Lateral: 3.2 cm
Weight: 2064 oz

## 2020-02-11 LAB — RESP PANEL BY RT-PCR (FLU A&B, COVID) ARPGX2
Influenza A by PCR: NEGATIVE
Influenza B by PCR: NEGATIVE
SARS Coronavirus 2 by RT PCR: NEGATIVE

## 2020-02-11 MED ORDER — ASPIRIN 325 MG PO TBEC: 325.0000 mg | DELAYED_RELEASE_TABLET | Freq: Every day | ORAL | 2 refills | Status: AC

## 2020-02-11 MED ORDER — ATORVASTATIN CALCIUM 40 MG PO TABS: 40.0000 mg | ORAL_TABLET | Freq: Every day | ORAL | 2 refills | Status: DC

## 2020-02-11 MED ORDER — CLOPIDOGREL BISULFATE 75 MG PO TABS: 75.0000 mg | ORAL_TABLET | Freq: Every day | ORAL | 0 refills | Status: DC

## 2020-02-11 MED ORDER — CLOPIDOGREL BISULFATE 75 MG PO TABS: 75.0000 mg | ORAL_TABLET | Freq: Every day | ORAL | Status: DC

## 2020-02-11 MED ORDER — CLOPIDOGREL BISULFATE 300 MG PO TABS
300.0000 mg | ORAL_TABLET | Freq: Once | ORAL | Status: AC
  Administered 2020-02-11: 11:00:00 300 mg via ORAL
  Filled 2020-02-11: qty 1

## 2020-02-11 MED ORDER — ASPIRIN EC 325 MG PO TBEC
325.0000 mg | DELAYED_RELEASE_TABLET | Freq: Every day | ORAL | Status: DC
  Administered 2020-02-11: 11:00:00 325 mg via ORAL
  Filled 2020-02-11: qty 1

## 2020-02-11 MED ORDER — ATORVASTATIN CALCIUM 40 MG PO TABS
40.0000 mg | ORAL_TABLET | Freq: Every day | ORAL | Status: DC
  Filled 2020-02-11: qty 1

## 2020-02-11 NOTE — Progress Notes (Signed)
STROKE TEAM PROGRESS NOTE   INTERVAL HISTORY Her PT and Dr. Pietro Cassis are at the bedside.  Pt sitting in bed, stated that her symptoms all resolved. MRI did show a small focus of left ACA infarct.  CTA head and neck showed distal left A2/A3 stenosis versus occlusion.  Orthostatic vital negative.  OBJECTIVE Vitals:   02/11/20 0215 02/11/20 0500 02/11/20 0800 02/11/20 0945  BP: 132/68 (!) 150/79 113/70 137/87  Pulse: 73 61 71 74  Resp: 18 16 18 16   Temp:      TempSrc:      SpO2: 95% 95% 94% 96%  Weight:      Height:        CBC:  Recent Labs  Lab 02/10/20 1738 02/10/20 1751  WBC 6.6  --   NEUTROABS 3.8  --   HGB 13.0 13.3  HCT 38.9 39.0  MCV 87.0  --   PLT 294  --     Basic Metabolic Panel:  Recent Labs  Lab 02/10/20 1738 02/10/20 1751  NA 140 138  K 4.0 4.0  CL 106 107  CO2 23  --   GLUCOSE 106* 108*  BUN 18 22  CREATININE 0.83 0.70  CALCIUM 9.3  --     Lipid Panel:     Component Value Date/Time   CHOL 182 02/11/2020 0351   TRIG 93 02/11/2020 0351   HDL 48 02/11/2020 0351   CHOLHDL 3.8 02/11/2020 0351   VLDL 19 02/11/2020 0351   LDLCALC 115 (H) 02/11/2020 0351   HgbA1c:  Lab Results  Component Value Date   HGBA1C 5.2 02/11/2020   Urine Drug Screen: No results found for: LABOPIA, COCAINSCRNUR, LABBENZ, AMPHETMU, THCU, LABBARB  Alcohol Level No results found for: ETH  IMAGING  CT ANGIO HEAD W OR WO CONTRAST CT ANGIO NECK W OR WO CONTRAST 02/10/2020 IMPRESSION:  1. No emergent large vessel occlusion or high-grade stenosis of the intracranial arteries.  2. Moderate narrowing of the midportion of the left MCA M2 inferior division.  3. Mild bilateral carotid bifurcation atherosclerosis without hemodynamically significant stenosis by NASCET criteria.  MR BRAIN WO CONTRAST 02/10/2020 IMPRESSION:  1. Small focus of acute ischemia within the left parietal white matter. No hemorrhage or mass effect.  2. Moderate chronic small vessel ischemic disease.    CT HEAD CODE STROKE WO CONTRAST 02/10/2020   IMPRESSION:  A lacunar infarct within the right thalamus is new from the prior brain MRI of 06/09/2015, but otherwise age indeterminate. No evidence of acute intracranial hemorrhage or acute demarcated cortical infarction. 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 Background mild cerebral atrophy and moderate chronic small vessel ischemic disease.    MR LUMBAR SPINE WO CONTRAST 02/10/2020 IMPRESSION:  1. Severe facet arthrosis at L4-5 and L5-S1, which may serve as a source of local low back pain.  2. Grade 1 anterolisthesis at L3-4 and L4-5. 3. No spinal canal or neural foraminal stenosis.   Transthoracic Echocardiogram  1. Left ventricular ejection fraction, by estimation, is 50 to 55%. The  left ventricle has low normal function. The left ventricle has no regional  wall motion abnormalities. There is mild asymmetric left ventricular  hypertrophy of the basal-septal  segment. Left ventricular diastolic parameters were normal.  2. Right ventricular systolic function is normal. The right ventricular  size is normal. There is normal pulmonary artery systolic pressure.  3. The mitral valve is normal in structure. Mild mitral valve  regurgitation.  4. Tricuspid valve regurgitation  is mild to moderate.  5. The aortic valve is tricuspid. Aortic valve regurgitation is mild.  6. The inferior vena cava is normal in size with greater than 50%  respiratory variability, suggesting right atrial pressure of 3 mmHg.   ECG - SR rate 66 BPM. (See cardiology reading for complete details)   PHYSICAL EXAM  Temp:  [97.9 F (36.6 C)] 97.9 F (36.6 C) (12/17 1550) Pulse Rate:  [61-78] 72 (12/17 1550) Resp:  [9-20] 18 (12/17 1550) BP: (113-152)/(65-87) 142/77 (12/17 1550) SpO2:  [94 %-98 %] 95 % (12/17 1550)  General - Well nourished, well developed, in no apparent distress.  Ophthalmologic - fundi not  visualized due to noncooperation.  Cardiovascular - Regular rhythm and rate.  Mental Status -  Level of arousal and orientation to time, place, and person were intact. Language including expression, naming, repetition, comprehension was assessed and found intact. Attention span and concentration were normal. Fund of Knowledge was assessed and was intact.  Cranial Nerves II - XII - II - Visual field intact OU. III, IV, VI - Extraocular movements intact. V - Facial sensation intact bilaterally. VII - Facial movement intact bilaterally. VIII - Hearing & vestibular intact bilaterally. X - Palate elevates symmetrically. XI - Chin turning & shoulder shrug intact bilaterally. XII - Tongue protrusion intact.  Motor Strength - The patient's strength was normal in all extremities and pronator drift was absent.  Bulk was normal and fasciculations were absent.   Motor Tone - Muscle tone was assessed at the neck and appendages and was normal.  Reflexes - The patient's reflexes were symmetrical in all extremities and she had no pathological reflexes.  Sensory - Light touch, temperature/pinprick were assessed and were symmetrical.    Coordination - The patient had normal movements in the hands and feet with no ataxia or dysmetria.  Tremor was absent.  Gait and Station - deferred.   ASSESSMENT/PLAN [name] is a 69 y.o. female with a past medical history significant for depression and intermittent dizzy attacks, presenting with intermittent right leg weakness x 6 weeks. Her dizzy attacks, began in 2016 and tend to occur about once a year.  She did not receive IV t-PA due to late presentation (>4.5 hours from time of onset) and resolution of deficits..  Stroke: Small focus of acute ischemia within the left ACA territory, likely due to left A2/A3 stenosis/occlusion  CT Head - A lacunar infarct within the right thalamus is new from the prior brain MRI of 06/09/2015, but otherwise age  indeterminate. 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   MRI head -  Small focus of acute ischemia within the left parietal white matter. No hemorrhage or mass effect.   CTA H&N - No emergent large vessel occlusion or high-grade stenosis of the intracranial arteries. Moderate narrowing of the midportion of the left MCA M2 inferior division. Mild bilateral carotid bifurcation atherosclerosis without hemodynamically significant stenosis by NASCET criteria.  On my read, there is a left A2/A3 high-grade stenosis versus occlusion  2D Echo EF 50 to 55%  Still recommend 30-day CardioNet monitor as outpatient to rule out A. fib  [employer] Virus 2 - negative  LDL - 115  HgbA1c - 5.2  VTE prophylaxis - Lovenox  No antithrombotic prior to admission, now on aspirin 325 mg daily and clopidogrel 75 mg daily DAPT for 3 months and then aspirin alone given large vessel stenosis/occlusion.  Patient counseled to be compliant with her  antithrombotic medications  Ongoing aggressive stroke risk factor management  Therapy recommendations:  pending  Disposition:  Pending  Hypertension  Stable . Orthostatic vital stable no orthostatic hypotension . Long-term BP goal normotensive  Hyperlipidemia  Home Lipid lowering medication: none   LDL 115, goal < 70  Current lipid lowering medication: Lipitor 40 mg  Continue statin at discharge  Other Stroke Risk Factors  Advanced age  ETOH use, advised to drink no more than 1 alcoholic beverage per day.  Previous right temporal encephalomalacia by imaging  Other Active Problems, Findings and Recommendations  Code status - Full code  History of vertigo following with Dr. Leta Baptist at [facility] day # 0  Neurology will sign off. Please call with questions. Pt will follow up with Dr. Leta Baptist at [facility] in about 4 weeks. Thanks for the consult.  Rosalin Hawking, MD PhD Stroke Neurology 02/11/2020 7:09  PM    To contact Stroke Continuity provider, please refer to [email or web address] After hours, contact General Neurology

## 2020-02-11 NOTE — ED Notes (Addendum)
Per Dr. Pietro Cassis, patient okay for discharge. Discharge instructions provided to patient and family along with prescription information. Verbalized understanding. Alert and oriented. IV lock removed and intact. Escorted out of ED via w/c with family.

## 2020-02-11 NOTE — Progress Notes (Signed)
OT Cancellation Note  Patient Details Name: Melanie Chen MRN: [id number] DOB: [birth year]   Cancelled Treatment:    Reason Eval/Treat Not Completed: OT screened, no needs identified, will sign off. Per PT all symptoms have resolved.   Skyeler Scalese,HILLARY 02/11/2020, 10:38 AM  Maurie Boettcher, OT/L   Acute OT Clinical Specialist Acute Rehabilitation Services Pager [phone] Office [phone]

## 2020-02-11 NOTE — ED Notes (Signed)
Pt up to restroom with no difficulties.

## 2020-02-11 NOTE — Progress Notes (Signed)
°  Echocardiogram 2D Echocardiogram has been performed.  Darlina Sicilian M 02/11/2020, 11:51 AM

## 2020-02-11 NOTE — ED Notes (Signed)
Assisted patient to bathroom. Pt is ambulatory with steady gait. Resp even and unlabored.

## 2020-02-11 NOTE — ED Notes (Signed)
Pt up to restroom with no difficulties, back to room placed back on monitor.

## 2020-02-11 NOTE — ED Notes (Signed)
Breakfast ordered 

## 2020-02-11 NOTE — Discharge Summary (Signed)
Physician Discharge Summary  Melanie Chen XTK:[id number] DOB: Sep 01, 1950 DOA: 02/10/2020  PCP: Kathyrn Lass, MD  Admit date: 02/10/2020 Discharge date: 02/11/2020  Admitted From: Home Discharge disposition: Home   Code Status: Full Code  Diet Recommendation: Cardiac diet  Discharge Diagnosis:   Principal Problem:   Acute focal neurologic deficit with partial resolution Active Problems:   Depression   Osteoporosis   Cerebral thrombosis with cerebral infarction    History of Present Illness / Brief narrative:  Melanie Chen is a 69 y.o. female with PMH significant for dizziness, osteoporosis, depression who presented to the ED on 12/16 with recurrent episodes of right leg weakness and numbness.  She has been having these episodes for the last month on and they have been more frequent in the last 3 days lasting several hours.   Hemodynamically stable in the ED Labs unremarkable Symptoms improved in the ED. CT head did not show any acute infarct but did show a new lacunar infarct since her previous MRI of 2017; also noted other prior infarctions. MRI brain showed a small focus of acute ischemia within the left parietal white matter and moderate chronic small vessel ischemic disease. CT angio of head and neck did not show any emergent large vessel occlusion or high-grade stenosis of the intracranial arteries. Admitted to hospitalist service. Neurology consultation was obtained.  Subjective:  Seen and examined this morning in the ED.  Pleasant elderly Caucasian female.  Lying down in bed.  Not in distress.  Work with physical therapy.  No orthostatic drop in blood pressure on standing up.  Neurology was at bedside.  Hospital Course:  Left parietal punctate stroke -Presented with right leg weakness and numbness. -MRI findings as above showing acute ischemia within the left parietal white matter. -Imagings also showed moderate chronic small vessel ischemia and evidence of  previous strokes. -Neurology consultation appreciated. -Stroke work-up done. -Prior to admission, patient was not on any antiplatelet or statin. -Per neurology recommendation, patient got loading dose of aspirin 325 mg and Plavix 300 mg today.  We will discharge her with a combination of aspirin 325 mg daily and Plavix 75 mg daily for 3 months.  After that, patient will continue aspirin 325 mg daily alone. -Also started on Lipitor 40 mg daily. -PT OT eval obtained.  No therapy recommended. -Echocardiogram showed EF of 50 to 55%, no intracardiac source of embolism.  Hyperlipidemia -started on Lipitor 40 mg daily.    Component Value Date/Time   CHOL 182 02/11/2020 0351   TRIG 93 02/11/2020 0351   HDL 48 02/11/2020 0351   CHOLHDL 3.8 02/11/2020 0351   VLDL 19 02/11/2020 0351   LDLCALC 115 (H) 02/11/2020 0351   Depression -Continue sertraline  Osteoporosis -Continue calcium and vitamin D  Ophthalmological problems >No daily eyedrops at this time, does use as needed eyedrops -We will await pharmacy med rec if as needed eyedrops are needed  Stable for discharge home today.  Wound care:    Discharge Exam:   Vitals:   02/11/20 0500 02/11/20 0800 02/11/20 0945 02/11/20 1300  BP: (!) 150/79 113/70 137/87 127/87  Pulse: 61 71 74 78  Resp: 16 18 16 20   Temp:      TempSrc:      SpO2: 95% 94% 96% 98%  Weight:      Height:        Body mass index is 20.82 kg/m.  General exam: Pleasant, elderly, not in distress Skin: No rashes, lesions or ulcers.  HEENT: Atraumatic, normocephalic, no obvious bleeding Lungs: Clear to auscultation bilaterally CVS: Regular rate and rhythm, no murmur GI/Abd soft, nontender, nondistended, bowel sound present CNS: Alert, awake, oriented x3 Psychiatry: Mood appropriate Extremities: No pedal edema, no calf tenderness  Follow ups:   Discharge Instructions    Diet - low sodium heart healthy   Complete by: As directed    Increase activity  slowly   Complete by: As directed       Follow-up Information    Kathyrn Lass, MD Follow up.   Specialty: Family Medicine Contact information: [place] [address] [phone]               Recommendations for Outpatient Follow-Up:   1. Follow-up with neurology as an outpatient  Discharge Instructions:  Follow with Primary MD Kathyrn Lass, MD in 7 days   Get CBC/BMP checked in next visit within 1 week by PCP or SNF MD ( we routinely change or add medications that can affect your baseline labs and fluid status, therefore we recommend that you get the mentioned basic workup next visit with your PCP, your PCP may decide not to get them or add new tests based on their clinical decision)  On your next visit with your PCP, please Get Medicines reviewed and adjusted.  Please request your PCP  to go over all Hospital Tests and Procedure/Radiological results at the follow up, please get all Hospital records sent to your Prim MD by signing hospital release before you go home.  Activity: As tolerated with Full fall precautions use walker/cane & assistance as needed  For Heart failure patients - Check your Weight same time everyday, if you gain over 2 pounds, or you develop in leg swelling, experience more shortness of breath or chest pain, call your Primary MD immediately. Follow Cardiac Low Salt Diet and 1.5 lit/day fluid restriction.  If you have smoked or chewed Tobacco in the last 2 yrs please stop smoking, stop any regular Alcohol  and or any Recreational drug use.  If you experience worsening of your admission symptoms, develop shortness of breath, life threatening emergency, suicidal or homicidal thoughts you must seek medical attention immediately by calling 911 or calling your MD immediately  if symptoms less severe.  You Must read complete instructions/literature along with all the possible adverse reactions/side effects for all the Medicines you take and  that have been prescribed to you. Take any new Medicines after you have completely understood and accpet all the possible adverse reactions/side effects.   Do not drive, operate heavy machinery, perform activities at heights, swimming or participation in water activities or provide baby sitting services if your were admitted for syncope or siezures until you have seen by Primary MD or a Neurologist and advised to do so again.  Do not drive when taking Pain medications.  Do not take more than prescribed Pain, Sleep and Anxiety Medications  Wear Seat belts while driving.   Please note You were cared for by a hospitalist during your hospital stay. If you have any questions about your discharge medications or the care you received while you were in the hospital after you are discharged, you can call the unit and asked to speak with the hospitalist on call if the hospitalist that took care of you is not available. Once you are discharged, your primary care physician will handle any further medical issues. Please note that NO REFILLS for any discharge medications will be authorized once you are  discharged, as it is imperative that you return to your primary care physician (or establish a relationship with a primary care physician if you do not have one) for your aftercare needs so that they can reassess your need for medications and monitor your lab values.    Allergies as of 02/11/2020   No Known Allergies     Medication List    TAKE these medications   Acetaminophen 500 MG capsule Take 500-1,000 mg by mouth every 6 (six) hours as needed for fever or pain.   aspirin 325 MG EC tablet Take 1 tablet (325 mg total) by mouth daily. Start taking on: February 12, 2020   atorvastatin 40 MG tablet Commonly known as: LIPITOR Take 1 tablet (40 mg total) by mouth daily. Start taking on: February 12, 2020   clopidogrel 75 MG tablet Commonly known as: PLAVIX Take 1 tablet (75 mg total) by mouth  daily. Start taking on: February 12, 2020   NONFORMULARY OR COMPOUNDED ITEM Take 5,000 Units by mouth See admin instructions. Take 50,000 units by mouth every Monday   ondansetron 4 MG disintegrating tablet Commonly known as: ZOFRAN-ODT Take 4 mg by mouth every 8 (eight) hours as needed (FOR VERTIGO ATTACKS- DISSOLVE ORALLY).   Prolia 60 MG/ML Sosy injection Generic drug: denosumab Inject 60 mg into the skin every 6 (six) months.   sertraline 50 MG tablet Commonly known as: ZOLOFT Take 50 mg by mouth daily.       Time coordinating discharge: 35 minutes  The results of significant diagnostics from this hospitalization (including imaging, microbiology, ancillary and laboratory) are listed below for reference.    Procedures and Diagnostic Studies:   CT ANGIO HEAD W OR WO CONTRAST  Result Date: 02/10/2020 CLINICAL DATA:  Acute neurologic deficit EXAM: CT ANGIOGRAPHY HEAD AND NECK TECHNIQUE: Multidetector CT imaging of the head and neck was performed using the standard protocol during bolus administration of intravenous contrast. Multiplanar CT image reconstructions and MIPs were obtained to evaluate the vascular anatomy. Carotid stenosis measurements (when applicable) are obtained utilizing NASCET criteria, using the distal internal carotid diameter as the denominator. CONTRAST:  111mL OMNIPAQUE IOHEXOL 350 MG/ML SOLN COMPARISON:  None. FINDINGS: CTA NECK FINDINGS SKELETON: There is no bony spinal canal stenosis. No lytic or blastic lesion. OTHER NECK: Normal pharynx, larynx and major salivary glands. No cervical lymphadenopathy. Unremarkable thyroid gland. UPPER CHEST: No pneumothorax or pleural effusion. No nodules or masses. AORTIC ARCH: There is no calcific atherosclerosis of the aortic arch. There is no aneurysm, dissection or hemodynamically significant stenosis of the visualized portion of the aorta. Conventional 3 vessel aortic branching pattern. The visualized proximal subclavian  arteries are widely patent. RIGHT CAROTID SYSTEM: No dissection, occlusion or aneurysm. Mild atherosclerotic calcification at the carotid bifurcation without hemodynamically significant stenosis. LEFT CAROTID SYSTEM: No dissection, occlusion or aneurysm. Mild atherosclerotic calcification at the carotid bifurcation without hemodynamically significant stenosis. VERTEBRAL ARTERIES: Left dominant configuration. Both origins are clearly patent. There is no dissection, occlusion or flow-limiting stenosis to the skull base (V1-V3 segments). CTA HEAD FINDINGS POSTERIOR CIRCULATION: --Vertebral arteries: Normal V4 segments. --Inferior cerebellar arteries: Normal. --Basilar artery: Normal. --Superior cerebellar arteries: Normal. --Posterior cerebral arteries (PCA): Normal. ANTERIOR CIRCULATION: --Intracranial internal carotid arteries: Normal. --Anterior cerebral arteries (ACA): Normal. Both A1 segments are present. Patent anterior communicating artery (a-comm). --Middle cerebral arteries (MCA): Mild narrowing of the proximal right M2 segment. Moderate narrowing of the midportion of the left M2 inferior division. VENOUS SINUSES: As permitted by contrast timing,  patent. ANATOMIC VARIANTS: Fetal origin of the left posterior cerebral artery. Review of the MIP images confirms the above findings. IMPRESSION: 1. No emergent large vessel occlusion or high-grade stenosis of the intracranial arteries. 2. Moderate narrowing of the midportion of the left MCA M2 inferior division. 3. Mild bilateral carotid bifurcation atherosclerosis without hemodynamically significant stenosis by NASCET criteria. Electronically Signed   By: Ulyses Jarred M.D.   On: 02/10/2020 22:28   CT ANGIO NECK W OR WO CONTRAST  Result Date: 02/10/2020 CLINICAL DATA:  Acute neurologic deficit EXAM: CT ANGIOGRAPHY HEAD AND NECK TECHNIQUE: Multidetector CT imaging of the head and neck was performed using the standard protocol during bolus administration of  intravenous contrast. Multiplanar CT image reconstructions and MIPs were obtained to evaluate the vascular anatomy. Carotid stenosis measurements (when applicable) are obtained utilizing NASCET criteria, using the distal internal carotid diameter as the denominator. CONTRAST:  170mL OMNIPAQUE IOHEXOL 350 MG/ML SOLN COMPARISON:  None. FINDINGS: CTA NECK FINDINGS SKELETON: There is no bony spinal canal stenosis. No lytic or blastic lesion. OTHER NECK: Normal pharynx, larynx and major salivary glands. No cervical lymphadenopathy. Unremarkable thyroid gland. UPPER CHEST: No pneumothorax or pleural effusion. No nodules or masses. AORTIC ARCH: There is no calcific atherosclerosis of the aortic arch. There is no aneurysm, dissection or hemodynamically significant stenosis of the visualized portion of the aorta. Conventional 3 vessel aortic branching pattern. The visualized proximal subclavian arteries are widely patent. RIGHT CAROTID SYSTEM: No dissection, occlusion or aneurysm. Mild atherosclerotic calcification at the carotid bifurcation without hemodynamically significant stenosis. LEFT CAROTID SYSTEM: No dissection, occlusion or aneurysm. Mild atherosclerotic calcification at the carotid bifurcation without hemodynamically significant stenosis. VERTEBRAL ARTERIES: Left dominant configuration. Both origins are clearly patent. There is no dissection, occlusion or flow-limiting stenosis to the skull base (V1-V3 segments). CTA HEAD FINDINGS POSTERIOR CIRCULATION: --Vertebral arteries: Normal V4 segments. --Inferior cerebellar arteries: Normal. --Basilar artery: Normal. --Superior cerebellar arteries: Normal. --Posterior cerebral arteries (PCA): Normal. ANTERIOR CIRCULATION: --Intracranial internal carotid arteries: Normal. --Anterior cerebral arteries (ACA): Normal. Both A1 segments are present. Patent anterior communicating artery (a-comm). --Middle cerebral arteries (MCA): Mild narrowing of the proximal right M2 segment.  Moderate narrowing of the midportion of the left M2 inferior division. VENOUS SINUSES: As permitted by contrast timing, patent. ANATOMIC VARIANTS: Fetal origin of the left posterior cerebral artery. Review of the MIP images confirms the above findings. IMPRESSION: 1. No emergent large vessel occlusion or high-grade stenosis of the intracranial arteries. 2. Moderate narrowing of the midportion of the left MCA M2 inferior division. 3. Mild bilateral carotid bifurcation atherosclerosis without hemodynamically significant stenosis by NASCET criteria. Electronically Signed   By: Ulyses Jarred M.D.   On: 02/10/2020 22:28   MR BRAIN WO CONTRAST  Result Date: 02/10/2020 CLINICAL DATA:  Seizure EXAM: MRI HEAD WITHOUT CONTRAST TECHNIQUE: Multiplanar, multiecho pulse sequences of the brain and surrounding structures were obtained without intravenous contrast. COMPARISON:  06/09/2015 FINDINGS: Brain: Small focus of abnormal diffusion restriction within the left parietal white matter. No acute or chronic hemorrhage. Hyperintense T2-weighted signal is moderately widespread throughout the white matter. Parenchymal volume and CSF spaces are normal. The midline structures are normal. Vascular: Major flow voids are preserved. Skull and upper cervical spine: Normal calvarium and skull base. Visualized upper cervical spine and soft tissues are normal. Sinuses/Orbits:No paranasal sinus fluid levels or advanced mucosal thickening. No mastoid or middle ear effusion. Normal orbits. IMPRESSION: 1. Small focus of acute ischemia within the left parietal white matter. No hemorrhage or  mass effect. 2. Moderate chronic small vessel ischemic disease. Electronically Signed   By: Ulyses Jarred M.D.   On: 02/10/2020 21:16   MR LUMBAR SPINE WO CONTRAST  Result Date: 02/10/2020 CLINICAL DATA:  Low back pain EXAM: MRI LUMBAR SPINE WITHOUT CONTRAST TECHNIQUE: Multiplanar, multisequence MR imaging of the lumbar spine was performed. No  intravenous contrast was administered. COMPARISON:  None. FINDINGS: Segmentation:  Standard. Alignment:  Grade 1 anterolisthesis at L3-4 and L4-5 Vertebrae:  No fracture, evidence of discitis, or bone lesion. Conus medullaris and cauda equina: Conus extends to the L2 level. Conus and cauda equina appear normal. Paraspinal and other soft tissues: Negative Disc levels: T12-L1: Small disc bulge. No spinal canal or neural foraminal stenosis. L1-L2: Normal disc space and facets. No spinal canal stenosis. No neural foraminal stenosis. L2-L3: Normal disc space and facet joints. No spinal canal stenosis. No neural foraminal stenosis. L3-L4: Normal disc space and facet joints. No spinal canal stenosis. No neural foraminal stenosis. L4-L5: Severe facet hypertrophy. Disc uncovering. No spinal canal stenosis. No neural foraminal stenosis. L5-S1: Severe facet hypertrophy. No disc herniation. No spinal canal stenosis. No neural foraminal stenosis. Visualized sacrum: Normal. IMPRESSION: 1. Severe facet arthrosis at L4-5 and L5-S1, which may serve as a source of local low back pain. 2. Grade 1 anterolisthesis at L3-4 and L4-5. 3. No spinal canal or neural foraminal stenosis. Electronically Signed   By: Ulyses Jarred M.D.   On: 02/10/2020 21:52   ECHOCARDIOGRAM COMPLETE  Result Date: 02/11/2020    ECHOCARDIOGRAM REPORT   Patient Name:   COLLEENE SWARTHOUT Date of Exam: 02/11/2020 Medical Rec #:  [id number]       Height:       66.0 in Accession #:    [id number]      Weight:       129.0 lb Date of Birth:  [birth year]       BSA:          1.660 m Patient Age:    36 years        BP:           137/87 mmHg Patient Gender: F               HR:           68 bpm. Exam Location:  Inpatient Procedure: 2D Echo, Color Doppler and Cardiac Doppler Indications:    Stroke 434.91 / I163.9  History:        Patient has no prior history of Echocardiogram examinations.  Sonographer:    Darlina Sicilian RDCS Referring Phys: [id number] [place]  1. Left ventricular ejection fraction, by estimation, is 50 to 55%. The left ventricle has low normal function. The left ventricle has no regional wall motion abnormalities. There is mild asymmetric left ventricular hypertrophy of the basal-septal segment. Left ventricular diastolic parameters were normal.  2. Right ventricular systolic function is normal. The right ventricular size is normal. There is normal pulmonary artery systolic pressure.  3. The mitral valve is normal in structure. Mild mitral valve regurgitation.  4. Tricuspid valve regurgitation is mild to moderate.  5. The aortic valve is tricuspid. Aortic valve regurgitation is mild.  6. The inferior vena cava is normal in size with greater than 50% respiratory variability, suggesting right atrial pressure of 3 mmHg. Comparison(s): No prior Echocardiogram. Conclusion(s)/Recommendation(s): No intracardiac source of embolism detected on this transthoracic study. A transesophageal echocardiogram is recommended to exclude cardiac source of embolism if  clinically indicated. FINDINGS  Left Ventricle: Left ventricular ejection fraction, by estimation, is 50 to 55%. The left ventricle has low normal function. The left ventricle has no regional wall motion abnormalities. The left ventricular internal cavity size was normal in size. There is mild asymmetric left ventricular hypertrophy of the basal-septal segment. Left ventricular diastolic parameters were normal. Right Ventricle: The right ventricular size is normal. No increase in right ventricular wall thickness. Right ventricular systolic function is normal. There is normal pulmonary artery systolic pressure. The tricuspid regurgitant velocity is 2.05 m/s, and  with an assumed right atrial pressure of 3 mmHg, the estimated right ventricular systolic pressure is 24.5 mmHg. Left Atrium: Left atrial size was normal in size. Right Atrium: Right atrial size was normal in size. Pericardium: There is no  evidence of pericardial effusion. Mitral Valve: The mitral valve is normal in structure. Mild to moderate mitral annular calcification. Mild mitral valve regurgitation. Tricuspid Valve: The tricuspid valve is normal in structure. Tricuspid valve regurgitation is mild to moderate. Aortic Valve: The aortic valve is tricuspid. Aortic valve regurgitation is mild. Aortic regurgitation PHT measures 632 msec. Pulmonic Valve: The pulmonic valve was not well visualized. Pulmonic valve regurgitation is not visualized. Aorta: The aortic root, ascending aorta, aortic arch and descending aorta are all structurally normal, with no evidence of dilitation or obstruction. Venous: The inferior vena cava is normal in size with greater than 50% respiratory variability, suggesting right atrial pressure of 3 mmHg. IAS/Shunts: The atrial septum is grossly normal.  LEFT VENTRICLE PLAX 2D LVIDd:         4.40 cm  Diastology LVIDs:         3.20 cm  LV e' medial:    4.03 cm/s LV PW:         1.00 cm  LV E/e' medial:  11.5 LV IVS:        1.40 cm  LV e' lateral:   7.51 cm/s LVOT diam:     2.10 cm  LV E/e' lateral: 6.2 LV SV:         43 LV SV Index:   26 LVOT Area:     3.46 cm  LEFT ATRIUM           Index LA diam:      3.30 cm 1.99 cm/m LA Vol (A4C): 26.9 ml 16.21 ml/m  AORTIC VALVE LVOT Vmax:   54.80 cm/s LVOT Vmean:  36.500 cm/s LVOT VTI:    0.124 m AI PHT:      632 msec  AORTA Ao Root diam: 3.00 cm Ao Asc diam:  3.50 cm MITRAL VALVE               TRICUSPID VALVE MV Area (PHT): 1.94 cm    TR Peak grad:   16.8 mmHg MV Decel Time: 391 msec    TR Vmax:        205.00 cm/s MV E velocity: 46.30 cm/s MV A velocity: 78.40 cm/s  SHUNTS MV E/A ratio:  0.59        Systemic VTI:  0.12 m                            Systemic Diam: 2.10 cm Buford Dresser MD Electronically signed by Buford Dresser MD Signature Date/Time: 02/11/2020/2:39:31 PM    Final    CT HEAD CODE STROKE WO CONTRAST`  Addendum Date: 02/10/2020   ADDENDUM REPORT:  02/10/2020 19:47 ADDENDUM: These results  were called by telephone at the time of interpretation on 02/10/2020 at 6:20 pm to provider Dr. Curly Shores, who verbally acknowledged these results. Electronically Signed   By: Kellie Simmering DO   On: 02/10/2020 19:47   Result Date: 02/10/2020 CLINICAL DATA:  Code stroke. Transient ischemic attack (TIA), right-sided weakness. EXAM: CT HEAD WITHOUT CONTRAST TECHNIQUE: Contiguous axial images were obtained from the base of the skull through the vertex without intravenous contrast. COMPARISON:  Brain MRI 06/09/2015. FINDINGS: Brain: Mild cerebral atrophy. 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 A lacunar infarct within the right thalamus is new from the prior brain MRI of 06/09/2015, but otherwise age indeterminate. Redemonstrated prominent perivascular spaces within the bilateral basal ganglia. Background moderate ill-defined hypoattenuation within the cerebral white matter is nonspecific, but compatible with chronic small vessel ischemic disease. There is no acute intracranial hemorrhage. No acute demarcated cortical infarct. No extra-axial fluid collection. No evidence of intracranial mass. No midline shift. Vascular: No hyperdense vessel.  Atherosclerotic calcifications. Skull: Normal. Negative for fracture or focal lesion. Sinuses/Orbits: Visualized orbits show no acute finding. Small right maxillary sinus mucous retention cyst. ASPECTS ([place] Stroke Program Early CT Score) - Ganglionic level infarction (caudate, lentiform nuclei, internal capsule, insula, M1-M3 cortex): 7 - Supraganglionic infarction (M4-M6 cortex): 3 Total score (0-10 with 10 being normal): 10 (on the left) IMPRESSION: A lacunar infarct within the right thalamus is new from the prior brain MRI of 06/09/2015, but otherwise age indeterminate. No evidence of acute intracranial hemorrhage or acute demarcated cortical infarction. Redemonstrated chronic  right MCA territory cortically based temporal lobe infarct. Background mild cerebral atrophy and moderate chronic small vessel ischemic disease. Electronically Signed: By: Kellie Simmering DO On: 02/10/2020 18:17     Labs:   Basic Metabolic Panel: Recent Labs  Lab 02/10/20 1738 02/10/20 1751  NA 140 138  K 4.0 4.0  CL 106 107  CO2 23  --   GLUCOSE 106* 108*  BUN 18 22  CREATININE 0.83 0.70  CALCIUM 9.3  --    GFR Estimated Creatinine Clearance: 59.6 mL/min (by C-G formula based on SCr of 0.7 mg/dL). Liver Function Tests: Recent Labs  Lab 02/10/20 1738  AST 16  ALT 13  ALKPHOS 48  BILITOT 0.4  PROT 6.6  ALBUMIN 3.7   No results for input(s): LIPASE, AMYLASE in the last 168 hours. No results for input(s): AMMONIA in the last 168 hours. Coagulation profile Recent Labs  Lab 02/10/20 1738  INR 1.0    CBC: Recent Labs  Lab 02/10/20 1738 02/10/20 1751  WBC 6.6  --   NEUTROABS 3.8  --   HGB 13.0 13.3  HCT 38.9 39.0  MCV 87.0  --   PLT 294  --    Cardiac Enzymes: No results for input(s): CKTOTAL, CKMB, CKMBINDEX, TROPONINI in the last 168 hours. BNP: Invalid input(s): POCBNP CBG: Recent Labs  Lab 02/10/20 1913  GLUCAP 108*   D-Dimer No results for input(s): DDIMER in the last 72 hours. Hgb A1c Recent Labs    02/11/20 0351  HGBA1C 5.2   Lipid Profile Recent Labs    02/11/20 0351  CHOL 182  HDL 48  LDLCALC 115*  TRIG 93  CHOLHDL 3.8   Thyroid function studies No results for input(s): TSH, T4TOTAL, T3FREE, THYROIDAB in the last 72 hours.  Invalid input(s): FREET3 Anemia work up No results for input(s): VITAMINB12, FOLATE, FERRITIN, TIBC, IRON, RETICCTPCT in the last 72 hours. Microbiology Recent  Results (from the past 240 hour(s))  Resp Panel by RT-PCR (Flu A&B, Covid) Nasopharyngeal Swab     Status: None   Collection Time: 02/10/20 10:16 PM   Specimen: Nasopharyngeal Swab; Nasopharyngeal(NP) swabs in vial transport medium  Result Value Ref  Range Status   SARS Coronavirus 2 by RT PCR NEGATIVE NEGATIVE Final    Comment: (NOTE) SARS-CoV-2 target nucleic acids are NOT DETECTED.  The SARS-CoV-2 RNA is generally detectable in upper respiratory specimens during the acute phase of infection. The lowest concentration of SARS-CoV-2 viral copies this assay can detect is 138 copies/mL. A negative result does not preclude SARS-Cov-2 infection and should not be used as the sole basis for treatment or other patient management decisions. A negative result may occur with  improper specimen collection/handling, submission of specimen other than nasopharyngeal swab, presence of viral mutation(s) within the areas targeted by this assay, and inadequate number of viral copies(<138 copies/mL). A negative result must be combined with clinical observations, patient history, and epidemiological information. The expected result is Negative.  Fact Sheet for Patients:  EntrepreneurPulse.com.au  Fact Sheet for Healthcare Providers:  IncredibleEmployment.be  This test is no t yet approved or cleared by the [place] FDA and  has been authorized for detection and/or diagnosis of SARS-CoV-2 by FDA under an Emergency Use Authorization (EUA). This EUA will remain  in effect (meaning this test can be used) for the duration of the COVID-19 declaration under Section 564(b)(1) of the Act, 21 U.S.C.section 360bbb-3(b)(1), unless the authorization is terminated  or revoked sooner.       Influenza A by PCR NEGATIVE NEGATIVE Final   Influenza B by PCR NEGATIVE NEGATIVE Final    Comment: (NOTE) The Xpert Xpress SARS-CoV-2/FLU/RSV plus assay is intended as an aid in the diagnosis of influenza from Nasopharyngeal swab specimens and should not be used as a sole basis for treatment. Nasal washings and aspirates are unacceptable for Xpert Xpress SARS-CoV-2/FLU/RSV testing.  Fact Sheet for  Patients: EntrepreneurPulse.com.au  Fact Sheet for Healthcare Providers: IncredibleEmployment.be  This test is not yet approved or cleared by the [place] FDA and has been authorized for detection and/or diagnosis of SARS-CoV-2 by FDA under an Emergency Use Authorization (EUA). This EUA will remain in effect (meaning this test can be used) for the duration of the COVID-19 declaration under Section 564(b)(1) of the Act, 21 U.S.C. section 360bbb-3(b)(1), unless the authorization is terminated or revoked.  Performed at [facility], [place] [address] [place], [place] [address]      Signed: Terrilee Croak  [facility] 02/11/2020, 2:59 PM

## 2020-02-11 NOTE — Evaluation (Signed)
Physical Therapy Evaluation and Discharge Patient Details Name: Melanie Chen MRN: [id number] DOB: Jul 11, 1950 Today's Date: 02/11/2020   History of Present Illness  Pt is a 69 y/o female admitted secondary to RLE weakness numbness. Pt reports symptoms have since resolved. Imaging showed L parietal matter ischemia.  Clinical Impression  Patient evaluated by Physical Therapy with no further acute PT needs identified. All education has been completed and the patient has no further questions. Pt overall steady with no LOB noted. Reports symptoms have improved. Educated about "BE FAST" symptoms in recognizing CVA symptoms. Reports husband can assist as needed. See below for any follow-up Physical Therapy or equipment needs. PT is signing off. Thank you for this referral. If needs change, please re-consult.     Follow Up Recommendations No PT follow up    Equipment Recommendations  None recommended by PT    Recommendations for Other Services       Precautions / Restrictions Precautions Precautions: Fall Restrictions Weight Bearing Restrictions: No      Mobility  Bed Mobility Overal bed mobility: Independent                  Transfers Overall transfer level: Independent                  Ambulation/Gait Ambulation/Gait assistance: Supervision Gait Distance (Feet): 150 Feet Assistive device: None Gait Pattern/deviations: Step-through pattern;Decreased stride length Gait velocity: Decreased   General Gait Details: Guarded gait, but overall steady. No LOB noted and pt reporting she was asymptomatic.  Stairs Stairs: Yes       General stair comments: Had pt perform marching tasks at EOB with no LOB noted to simulate stair navigation.  Wheelchair Mobility    Modified Rankin (Stroke Patients Only) Modified Rankin (Stroke Patients Only) Pre-Morbid Rankin Score: No symptoms Modified Rankin: No symptoms     Balance Overall balance assessment: No apparent  balance deficits (not formally assessed)                                           Pertinent Vitals/Pain Pain Assessment: No/denies pain    Home Living Family/patient expects to be discharged to:: Private residence Living Arrangements: Spouse/significant other Available Help at Discharge: Family Type of Home: House Home Access: Stairs to enter Entrance Stairs-Rails: None Technical brewer of Steps: 2 Home Layout: Two level Home Equipment: None      Prior Function Level of Independence: Independent               Hand Dominance        Extremity/Trunk Assessment   Upper Extremity Assessment Upper Extremity Assessment: Overall WFL for tasks assessed    Lower Extremity Assessment Lower Extremity Assessment: Overall WFL for tasks assessed    Cervical / Trunk Assessment Cervical / Trunk Assessment: Normal  Communication   Communication: No difficulties  Cognition Arousal/Alertness: Awake/alert Behavior During Therapy: WFL for tasks assessed/performed Overall Cognitive Status: Within Functional Limits for tasks assessed                                        General Comments General comments (skin integrity, edema, etc.): Checked orthostatics and orthostatics negative. See vitals flowsheet.    Exercises     Assessment/Plan    PT Assessment Patent does  not need any further PT services  PT Problem List         PT Treatment Interventions      PT Goals (Current goals can be found in the Care Plan section)  Acute Rehab PT Goals Patient Stated Goal: to go home PT Goal Formulation: With patient Time For Goal Achievement: 02/11/20 Potential to Achieve Goals: Good    Frequency     Barriers to discharge        Co-evaluation               AM-PAC PT "6 Clicks" Mobility  Outcome Measure Help needed turning from your back to your side while in a flat bed without using bedrails?: None Help needed moving from  lying on your back to sitting on the side of a flat bed without using bedrails?: None Help needed moving to and from a bed to a chair (including a wheelchair)?: None Help needed standing up from a chair using your arms (e.g., wheelchair or bedside chair)?: None Help needed to walk in hospital room?: None Help needed climbing 3-5 steps with a railing? : None 6 Click Score: 24    End of Session   Activity Tolerance: Patient tolerated treatment well Patient left: in bed;with call bell/phone within reach (on stretcher on ED) Nurse Communication: Mobility status PT Visit Diagnosis: Other symptoms and signs involving the nervous system (R29.898);Muscle weakness (generalized) (M62.81)    Time: 6415-8309 PT Time Calculation (min) (ACUTE ONLY): 26 min   Charges:   PT Evaluation $PT Eval Low Complexity: 1 Low PT Treatments $Gait Training: 8-22 mins        Lou Miner, DPT  Acute Rehabilitation Services  Pager: [phone] Office: [phone]   Rudean Hitt 02/11/2020, 10:32 AM

## 2020-03-20 ENCOUNTER — Ambulatory Visit (INDEPENDENT_AMBULATORY_CARE_PROVIDER_SITE_OTHER): Payer: Medicare Other | Admitting: Diagnostic Neuroimaging

## 2020-03-20 ENCOUNTER — Encounter: Payer: Self-pay | Admitting: Diagnostic Neuroimaging

## 2020-03-20 VITALS — BP 116/66 | HR 68 | Ht 66.0 in | Wt 131.6 lb

## 2020-03-20 DIAGNOSIS — I63522 Cerebral infarction due to unspecified occlusion or stenosis of left anterior cerebral artery: Secondary | ICD-10-CM | POA: Diagnosis not present

## 2020-03-20 MED ORDER — CLOPIDOGREL BISULFATE 75 MG PO TABS: 75.0000 mg | ORAL_TABLET | Freq: Every day | ORAL | 0 refills | Status: DC

## 2020-03-20 NOTE — Patient Instructions (Addendum)
 -   continue aspirin 325mg  daily + plavix 75mg  daily x 3 months after stroke; then aspirin 325mg  daily  - refer to PT evaluation for right leg weakness  - refer to cardiac event monitor (30 day)

## 2020-03-20 NOTE — Progress Notes (Signed)
[facility]  PATIENT: Melanie Chen DOB: [birth year]  REFERRING CLINICIAN: Sabra Heck  HISTORY FROM: patient  REASON FOR VISIT: follow up   HISTORICAL  CHIEF COMPLAINT:  Chief Complaint  Patient presents with  . Cerebrovascular Accident    RM 7 hospital FU, [facility], husband - Percell Miller "has run out of Plavix, taking ASA 325 mg daily; doing well except for balance issues"    HISTORY OF PRESENT ILLNESS:   UPDATE (03/20/20, VRP): Since last visit, no further dizzy attacks.  However on 02/10/2020 patient had new onset of right leg weakness and gait difficulty.  She went to the hospital for evaluation was diagnosed with left ACA stroke.  Stroke work-up was completed.  Patient was found to have possible intracranial atherosclerosis and started on dual antiplatelet therapy.  Stroke work-up otherwise was unremarkable.  Patient was discharged home.  Since that time patient is doing fairly well although her right leg weakness seems to worsen later in the afternoon.  UPDATE 07/18/15: Since last visit, doing well. No further dizzy attacks. Patient is doing yoga, eating clean, and taking care of her general health.   PRIOR HPI (11/29/14): 70 year old left-handed female here for evaluation of dizziness and nausea and vomiting. 09/10/14 patient woke up feeling off balance and dizzy staggering. She has significant nausea. She was unable to stand and walk. She declined going to the emergency room and slept in the bathroom all night. 09/23/14 patient had a similar but less severe attack.  10/04/14 patient is similar but less severe attack. No ringing in ears. No double vision. No slurred speech or trouble talking. Symptoms seem to have been aggravated by movement. Symptoms better if she was laying down particularly on her left side. She denies any significant spinning sensation but felt as if she was being pulled or falling to the left or right side. No prodromal infections, traumas, accidents. Patient  had some headaches with these attacks. Patient is now asymptomatic and doing well.   REVIEW OF SYSTEMS: Full 14 system review of systems performed and negative except: as per HPI.    ALLERGIES: No Known Allergies  HOME MEDICATIONS: Outpatient Medications Prior to Visit  Medication Sig Dispense Refill  . Acetaminophen 500 MG capsule Take 500-1,000 mg by mouth every 6 (six) hours as needed for fever or pain.    Marland Kitchen aspirin EC 325 MG EC tablet Take 1 tablet (325 mg total) by mouth daily. 30 tablet 2  . atorvastatin (LIPITOR) 40 MG tablet Take 1 tablet (40 mg total) by mouth daily. 30 tablet 2  . NONFORMULARY OR COMPOUNDED ITEM Take 5,000 Units by mouth See admin instructions. Take 50,000 units by mouth every Monday    . ondansetron (ZOFRAN-ODT) 4 MG disintegrating tablet Take 4 mg by mouth every 8 (eight) hours as needed (FOR VERTIGO ATTACKS- DISSOLVE ORALLY).    Marland Kitchen PROLIA 60 MG/ML SOSY injection Inject 60 mg into the skin every 6 (six) months.    . sertraline (ZOLOFT) 50 MG tablet Take 50 mg by mouth daily.  1  . clopidogrel (PLAVIX) 75 MG tablet Take 1 tablet (75 mg total) by mouth daily. 30 tablet 0   No facility-administered medications prior to visit.    PAST MEDICAL HISTORY: Past Medical History:  Diagnosis Date  . Colon polyp   . Depression   . Dizziness 09/10/14  . Hemorrhoids    pt unsure  . Osteoporosis     PAST SURGICAL HISTORY: Past Surgical History:  Procedure Laterality Date  .  COLONOSCOPY  09/2000  . COLONOSCOPY WITH PROPOFOL N/A 09/22/2019   Procedure: COLONOSCOPY WITH PROPOFOL;  Surgeon: Clarene Essex, MD;  Location: [facility] ENDOSCOPY;  Service: Endoscopy;  Laterality: N/A;  . HOT HEMOSTASIS N/A 09/22/2019   Procedure: HOT HEMOSTASIS (ARGON PLASMA COAGULATION/BICAP);  Surgeon: Clarene Essex, MD;  Location: [facility];  Service: Endoscopy;  Laterality: N/A;  . POLYPECTOMY  09/22/2019   Procedure: POLYPECTOMY;  Surgeon: Clarene Essex, MD;  Location: [facility] ENDOSCOPY;  Service:  Endoscopy;;  . TUBAL LIGATION  1983    FAMILY HISTORY: Family History  Problem Relation Age of Onset  . Cancer Father        colon  . Stroke Father   . Hypertension Mother     SOCIAL HISTORY:  Social History   Socioeconomic History  . Marital status: Married    Spouse name: [name]  . Number of children: 2  . Years of education: 62  . Highest education level: Not on file  Occupational History    Comment: retired  Tobacco Use  . Smoking status: Never Smoker  . Smokeless tobacco: Never Used  Substance and Sexual Activity  . Alcohol use: Yes    Alcohol/week: 0.0 standard drinks    Comment: occas wine, 2-3 x week  . Drug use: No  . Sexual activity: Not on file  Other Topics Concern  . Not on file  Social History Narrative      Lives at home with husband   Caffeine use- Coffee- 3 cups daily, soda 2 a week   Social Determinants of Health   Financial Resource Strain: Not on file  Food Insecurity: Not on file  Transportation Needs: Not on file  Physical Activity: Not on file  Stress: Not on file  Social Connections: Not on file  Intimate Partner Violence: Not on file     PHYSICAL EXAM  GENERAL EXAM/CONSTITUTIONAL: Vitals:  Vitals:   03/20/20 1438  BP: 116/66  Pulse: 68  Weight: 131 lb 9.6 oz (59.7 kg)  Height: 5\' 6"  (1.676 m)   Body mass index is 21.24 kg/m. No exam data present  Patient is in no distress; well developed, nourished and groomed; neck is supple  CARDIOVASCULAR:  Examination of carotid arteries is normal; no carotid bruits  Regular rate and rhythm, no murmurs  Examination of peripheral vascular system by observation and palpation is normal  EYES:  Ophthalmoscopic exam of optic discs and posterior segments is normal; no papilledema or hemorrhages  MUSCULOSKELETAL:  Gait, strength, tone, movements noted in Neurologic exam below  NEUROLOGIC: MENTAL STATUS:  No flowsheet data found.  awake, alert, oriented to person, place and  time  recent and remote memory intact  normal attention and concentration  language fluent, comprehension intact, naming intact,   fund of knowledge appropriate  CRANIAL NERVE:   2nd - no papilledema on fundoscopic exam  2nd, 3rd, 4th, 6th - pupils equal and reactive to light, visual fields full to confrontation, extraocular muscles intact, no nystagmus  5th - facial sensation symmetric  7th - facial strength symmetric  8th - hearing intact  9th - palate elevates symmetrically, uvula midline  11th - shoulder shrug symmetric  12th - tongue protrusion midline  MOTOR:   normal bulk and tone, full strength in the BUE, LLE  RLE 3 PROX, 3-4 DISTAL  SENSORY:   normal and symmetric to light touch, temperature, vibration   COORDINATION:   finger-nose-finger, fine finger movements normal  REFLEXES:   deep tendon reflexes present and symmetric  GAIT/STATION:   RIGHT FOOT DROP WEAKNESS; RIGHT LEG SPASTIC GAIT, DRAGGING; USING CANE; SLOW TO WALK    DIAGNOSTIC DATA (LABS, IMAGING, TESTING) - I reviewed patient records, labs, notes, testing and imaging myself where available.  Lab Results  Component Value Date   WBC 6.6 02/10/2020   HGB 13.3 02/10/2020   HCT 39.0 02/10/2020   MCV 87.0 02/10/2020   PLT 294 02/10/2020      Component Value Date/Time   NA 138 02/10/2020 1751   K 4.0 02/10/2020 1751   CL 107 02/10/2020 1751   CO2 23 02/10/2020 1738   GLUCOSE 108 (H) 02/10/2020 1751   BUN 22 02/10/2020 1751   CREATININE 0.70 02/10/2020 1751   CALCIUM 9.3 02/10/2020 1738   PROT 6.6 02/10/2020 1738   ALBUMIN 3.7 02/10/2020 1738   AST 16 02/10/2020 1738   ALT 13 02/10/2020 1738   ALKPHOS 48 02/10/2020 1738   BILITOT 0.4 02/10/2020 1738   GFRNONAA >60 02/10/2020 1738   Lab Results  Component Value Date   CHOL 182 02/11/2020   HDL 48 02/11/2020   LDLCALC 115 (H) 02/11/2020   TRIG 93 02/11/2020   CHOLHDL 3.8 02/11/2020   Lab Results  Component Value Date    HGBA1C 5.2 02/11/2020   No results found for: VITAMINB12 No results found for: TSH   11/04/14 MRI brain [I reviewed images myself and agree with interpretation. -VRP]  - Scattered foci of T2 hyperintensity in the subcortical white matter and periventricular white matter could be related to chronic small vessel ischemic disease. - Encephalomalacia and gliosis in the right anterior temporal lobe may be related to prior trauma or infarction.  06/20/15 MRI brain [I reviewed images myself and agree with interpretation. -VRP]  1. Right temporal superior and anterior gliosis (subcortical and juxtacortical) could be related to prior infarction, trauma or inflammation.  2. Scattered periventricular and subcortical foci of chronic small vessel ischemic disease.  3. No acute findings. No abnormal enhancing lesions.     ASSESSMENT AND PLAN  70 y.o. 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 female with a past medical history significant for depression and intermittent dizzy attacks, presenting with intermittent right leg weakness x 6 weeks (Dec 2021). Her dizzy attacks, began in 2016 and tend to occur about once a year.  She did not receive IV t-PA due to late presentation (>4.5 hours from time of onset) and resolution of deficits."  Stroke: Small focus of acute ischemia within the left ACA territory, possibly due to left A2/A3 stenosis/occlusion  CT Head - A lacunar infarct within the right thalamus is new from the prior brain MRI of 06/09/2015, but otherwise age indeterminate. 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   MRI head -  Small focus of acute ischemia within the left parietal white matter. No hemorrhage or mass effect.   CTA H&N - No emergent large vessel occlusion or high-grade stenosis of the intracranial arteries. Moderate narrowing of  the midportion of the left MCA M2 inferior division. Mild bilateral carotid bifurcation atherosclerosis without hemodynamically significant stenosis by NASCET criteria.   2D Echo EF 50 to 55%  Still recommend 30-day CardioNet monitor as outpatient to rule out A. fib  No antithrombotic prior to admission, now on aspirin 325 mg daily and clopidogrel 75 mg daily DAPT for 3 months and then  aspirin 325mg  daily alone given large vessel stenosis/occlusion.   Hypertension  Stable; monitor per PCP  Hyperlipidemia  LDL 115, goal < 70  Continue lipitor 40mg  daily   Meds ordered this encounter  Medications  . clopidogrel (PLAVIX) 75 MG tablet    Sig: Take 1 tablet (75 mg total) by mouth daily.    Dispense:  60 tablet    Refill:  0   Orders Placed This Encounter  Procedures  . Ambulatory referral to Physical Therapy  . Cardiac event monitor   Return for return to PCP, pending if symptoms worsen or fail to improve.    Penni Bombard, MD [id number], [id number] PM Certified in Neurology, Neurophysiology and Neuroimaging  [facility] [address], [place], [place] [address] [phone]

## 2020-03-22 ENCOUNTER — Encounter: Payer: Self-pay | Admitting: *Deleted

## 2020-03-23 DIAGNOSIS — M810 Age-related osteoporosis without current pathological fracture: Secondary | ICD-10-CM | POA: Diagnosis not present

## 2020-03-27 ENCOUNTER — Encounter (INDEPENDENT_AMBULATORY_CARE_PROVIDER_SITE_OTHER): Payer: Medicare Other

## 2020-03-27 DIAGNOSIS — I4891 Unspecified atrial fibrillation: Secondary | ICD-10-CM | POA: Diagnosis not present

## 2020-03-27 DIAGNOSIS — I63522 Cerebral infarction due to unspecified occlusion or stenosis of left anterior cerebral artery: Secondary | ICD-10-CM

## 2020-03-27 DIAGNOSIS — I639 Cerebral infarction, unspecified: Secondary | ICD-10-CM | POA: Diagnosis not present

## 2020-04-04 ENCOUNTER — Ambulatory Visit: Payer: Medicare Other | Attending: Diagnostic Neuroimaging | Admitting: Physical Therapy

## 2020-04-04 ENCOUNTER — Other Ambulatory Visit: Payer: Self-pay

## 2020-04-04 ENCOUNTER — Telehealth: Payer: Self-pay | Admitting: Diagnostic Neuroimaging

## 2020-04-04 ENCOUNTER — Encounter: Payer: Self-pay | Admitting: Physical Therapy

## 2020-04-04 DIAGNOSIS — I69353 Hemiplegia and hemiparesis following cerebral infarction affecting right non-dominant side: Secondary | ICD-10-CM | POA: Diagnosis not present

## 2020-04-04 DIAGNOSIS — R2689 Other abnormalities of gait and mobility: Secondary | ICD-10-CM | POA: Insufficient documentation

## 2020-04-04 DIAGNOSIS — M6281 Muscle weakness (generalized): Secondary | ICD-10-CM | POA: Diagnosis not present

## 2020-04-04 DIAGNOSIS — M25551 Pain in right hip: Secondary | ICD-10-CM | POA: Insufficient documentation

## 2020-04-04 DIAGNOSIS — R2681 Unsteadiness on feet: Secondary | ICD-10-CM | POA: Insufficient documentation

## 2020-04-04 NOTE — Telephone Encounter (Signed)
Called patient and advised she call cardiology, gave her # to [facility]. Patient verbalized understanding, appreciation.

## 2020-04-04 NOTE — Telephone Encounter (Signed)
Pls have her contact cardiology / monitoring clinic to discuss other options. -VRP

## 2020-04-04 NOTE — Telephone Encounter (Signed)
Pt called, stop using the heart monitor last night. Had some redness and pain. Adhesive irritated my skin. Is there another way to do heart monitor or stop all together, I can not tolerate using the heart monitor. Would like a call from the nurse.

## 2020-04-05 ENCOUNTER — Telehealth: Payer: Self-pay | Admitting: Internal Medicine

## 2020-04-05 NOTE — Therapy (Signed)
[facility] [address] [place], [place], [address] Phone: [phone]   Fax:  [phone]  Physical Therapy Evaluation  Patient Details  Name: Melanie Chen MRN: [id number] Date of Birth: [birth year] Referring Provider (PT): Penni Bombard, MD   Encounter Date: 04/04/2020   PT End of Session - 04/05/20 0547    Visit Number 1    Number of Visits 9    Date for PT Re-Evaluation 06/04/20    Authorization Type Federal BCBS and Medicare; VL PT/OT/ST: 75; 10th visit PN    Progress Note Due on Visit 10    PT Start Time 1237    PT Stop Time 1315    PT Time Calculation (min) 38 min    Activity Tolerance Patient tolerated treatment well    Behavior During Therapy [facility] for tasks assessed/performed           Past Medical History:  Diagnosis Date  . Colon polyp   . Depression   . Dizziness 09/10/14  . Hemorrhoids    pt unsure  . Osteoporosis     Past Surgical History:  Procedure Laterality Date  . COLONOSCOPY  09/2000  . COLONOSCOPY WITH PROPOFOL N/A 09/22/2019   Procedure: COLONOSCOPY WITH PROPOFOL;  Surgeon: Clarene Essex, MD;  Location: [facility];  Service: Endoscopy;  Laterality: N/A;  . HOT HEMOSTASIS N/A 09/22/2019   Procedure: HOT HEMOSTASIS (ARGON PLASMA COAGULATION/BICAP);  Surgeon: Clarene Essex, MD;  Location: [facility];  Service: Endoscopy;  Laterality: N/A;  . POLYPECTOMY  09/22/2019   Procedure: POLYPECTOMY;  Surgeon: Clarene Essex, MD;  Location: [facility];  Service: Endoscopy;;  . TUBAL LIGATION  1983    There were no vitals filed for this visit.    Subjective Assessment - 04/04/20 1241    Subjective Pt experienced onset of RLE weakness in December 2021; diagnosed with L ACA CVA.  Did not have [place] therapy; referred directly to outpatient.  Main impairments are in the RLE.  RLE fatigues through the day.  Started using a cane after CVA.  Not driving right now.  No issues with RUE, vision, speech,  swallowing.    Pertinent History colon polyp, depression, dizziness, osteoporosis    Limitations Walking    Patient Stated Goals Higher energy level; return to driving, RLE to be stronger    Currently in Pain? Yes   slight R hip pain             [facility] PT Assessment - 04/04/20 1247      Assessment   Medical Diagnosis L ACA CVA    Referring Provider (PT) Leta Baptist, Earlean Polka, MD    Onset Date/Surgical Date 03/20/20    Hand Dominance Left    Prior Therapy none      Precautions   Precautions Other (comment)    Precaution Comments colon polyp, depression, dizziness, osteoporosis      Balance Screen   Has the patient fallen in the past 6 months No      [place] residence    Living Arrangements Spouse/significant other    Type of [place] to enter    Entrance Stairs-Number of Steps 12    Entrance Stairs-Rails Right    [place] Multi-level;Bed/bath upstairs    Alternate Level Stairs-Number of Steps 12    Alternate [place] - single point    Additional Comments Not driving  currently      Prior Function   Level of Independence Independent    Leisure Volunteering; Yoga -home yoga practice      Observation/Other Assessments   Focus on Therapeutic Outcomes (FOTO)  Stroke LE: 62%      Sensation   Light Touch Appears Intact      Tone   Assessment Location Right Lower Extremity      ROM / Strength   AROM / PROM / Strength Strength      Strength   Overall Strength Deficits    Overall Strength Comments LLE: 5/5 except hip flexion 4/5.  RLE: 3/5 hip flexion, 4-/5 knee extension and knee flexion, ankle DF 4/5      Ambulation/Gait   Ambulation/Gait Yes    Ambulation/Gait Assistance 5: Supervision    Ambulation/Gait Assistance Details Pt was using cane on R side (affected side); educated pt on biomechanics of using cane on L side vs. R side for increased BOS.  Pt  switched cane to LUE and reported immediate improvement in stability.    Ambulation Distance (Feet) 230 Feet    Assistive device Straight cane    Gait Pattern Step-through pattern;Decreased step length - right;Decreased stance time - right;Decreased stride length;Decreased hip/knee flexion - right;Decreased dorsiflexion - right;Right foot flat;Poor foot clearance - right   LOB to the R   Ambulation Surface Level;Indoor    Stairs Yes    Stairs Assistance 5: Supervision    Stairs Assistance Details (indicate cue type and reason) difficulty clearing R foot    Stair Management Technique Two rails;Alternating pattern;Forwards    Number of Stairs 4    Height of Stairs 6      Standardized Balance Assessment   Standardized Balance Assessment Dynamic Gait Index;Five Times Sit to Stand;10 meter walk test    Five times sit to stand comments  30 second sit to stand without use of UE: 9 reps with intermittent pushing back of knee against mat and intermittent LOB posteriorly    10 Meter Walk 13 seconds with cane or 2.5 ft/sec      Dynamic Gait Index   Level Surface Mild Impairment    Change in Gait Speed Mild Impairment    Gait with Horizontal Head Turns Mild Impairment    Gait with Vertical Head Turns Moderate Impairment    Gait and Pivot Turn Mild Impairment    Step Over Obstacle Moderate Impairment    Step Around Obstacles Moderate Impairment    Steps Mild Impairment    Total Score 13    DGI comment: 13/24      High Level Balance   High Level Balance Comments --      RLE Tone   RLE Tone Mild;Hypertonic      RLE Tone   Hypertonic Details 1-2 beats of clonus                      Objective measurements completed on examination: See above findings.               PT Education - 04/05/20 0547    Education Details clinical findings, PT POC and goals, use of cane in LUE    Person(s) Educated Patient    Methods Explanation;Demonstration    Comprehension Verbalized  understanding;Returned demonstration            PT Short Term Goals - 04/05/20 0559      PT SHORT TERM GOAL #1   Title Pt will demonstrate  independence with initial HEP and will report return to home yoga practice 2x/week    Time 4    Period Weeks    Status New    Target Date 05/05/20      PT SHORT TERM GOAL #2   Title Pt will improve 30 second sit to stand to 11 reps    Baseline 9 reps    Time 4    Period Weeks    Status New    Target Date 05/05/20      PT SHORT TERM GOAL #3   Title Pt will improve DGI by 4 points to indicate decreased falls risk    Baseline 13/24    Time 4    Period Weeks    Status New    Target Date 05/05/20      PT SHORT TERM GOAL #4   Title Pt will increase gait velocity to >/= 2.9 ft/sec (or 0.9 m/sec)    Baseline .76 m/sec (2.5 ft/sec)    Time 4    Period Weeks    Status New    Target Date 05/05/20      PT SHORT TERM GOAL #5   Title Pt will negotiate 12 stairs with one rail, alternating sequence with supervision    Baseline pt has basement garage-climbs 12 to main level + 12 to upstairs bedroom    Time 4    Period Weeks    Status New    Target Date 05/05/20             PT Long Term Goals - 04/05/20 0607      PT LONG TERM GOAL #1   Title Pt will be independent with final HEP and will report full return to home yoga    Time 8    Period Weeks    Status New    Target Date 06/04/20      PT LONG TERM GOAL #2   Title Pt will report improvement in LE FOTO to >/= 74%    Baseline 62%    Time 8    Period Weeks    Status New    Target Date 06/04/20      PT LONG TERM GOAL #3   Title Pt will improve 30 second sit to stand to >/= 14 reps to indicate improved functional LE strength    Time 8    Period Weeks    Status New    Target Date 06/04/20      PT LONG TERM GOAL #4   Title Pt will improve DGI to >/= 20/24 to indicate decreased falls risk    Time 8    Period Weeks    Status New    Target Date 06/04/20      PT LONG TERM  GOAL #5   Title Pt will improve gait velocity without cane to >/= 3.2 ft/sec (or 1 m/sec)    Time 8    Period Weeks    Status New    Target Date 06/04/20      Additional Long Term Goals   Additional Long Term Goals Yes      PT LONG TERM GOAL #6   Title Pt will demonstrate ability to negotiate 2 flights of stairs (24) with 1 or no rail, alternating sequence, MOD I    Time 8    Period Weeks    Status New    Target Date 06/04/20      PT LONG TERM GOAL #7   Title  Pt will initiate return to driving with supervised driving short distances near her home.    Time 8    Period Weeks    Status New    Target Date 06/04/20                  Plan - 04/05/20 0549    Clinical Impression Statement Pt is a 69 year old female referred to Neuro OPPT for evaluation of RLE weakness due to L ACA CVA.  Pt's PMH is significant for the following: depression, dizziness, and osteoporosis. The following deficits were noted during pt's exam: pain in R hip, RLE weakness, impaired standing balance and abnormal gait. Pt's gait velocity, 30 second sit to stand and DGI scores indicate pt is at increased risk for falls. Pt also has a history of vertigo; pt is not currently experiencing vertigo but will assess and treat if pt does begin to experience symptoms.  Pt is very active in her community and would benefit from skilled PT to address these impairments and functional limitations to maximize functional mobility independence and reduce falls risk.    Personal Factors and Comorbidities Comorbidity 3+    Comorbidities depression, dizziness/vertigo, osteoporosis    Examination-Activity Limitations Carry;Locomotion Level;Stairs    Examination-Participation Restrictions Community Activity;Driving;Volunteer    Stability/Clinical Decision Making Stable/Uncomplicated    Clinical Decision Making Low    Rehab Potential Good    PT Frequency 1x / week    PT Duration 8 weeks    PT Treatment/Interventions ADLs/Self Care  Home Management;Aquatic Therapy;Canalith Repostioning;Electrical Stimulation;DME Instruction;Gait training;Stair training;Functional mobility training;Therapeutic activities;Therapeutic exercise;Balance training;Neuromuscular re-education;Patient/family education;Orthotic Fit/Training;Manual techniques;Passive range of motion;Dry needling;Moist Heat;Cryotherapy;Vestibular    PT Next Visit Plan Pt is high level; might be a candidate for Bion[employer] for RLE.    Initiate HEP for RLE strengthening and balance.  Progress gait to no cane.  She would like to return to home Yoga practice.    Recommended Other Services Pt has history of vertigo; none right now but if she develops please let me know and I can assess.    Consulted and Agree with Plan of Care Patient           Patient will benefit from skilled therapeutic intervention in order to improve the following deficits and impairments:  Abnormal gait,Decreased balance,Decreased strength,Difficulty walking,Pain  Visit Diagnosis: Hemiplegia and hemiparesis following cerebral infarction affecting right non-dominant side (HCC)  Muscle weakness (generalized)  Unsteadiness on feet  Other abnormalities of gait and mobility  Pain in right hip     Problem List Patient Active Problem List   Diagnosis Date Noted  . Cerebral thrombosis with cerebral infarction 02/11/2020  . Acute focal neurologic deficit with partial resolution 02/10/2020  . Depression 02/10/2020  . Osteoporosis 02/10/2020    Rico Junker, PT, DPT 04/05/20    6:17 AM    [facility] [address] [place], [place], [address] Phone: [phone]   Fax:  [phone]  Name: DAMIYAH DITMARS MRN: [id number] Date of Birth: 1950/02/29

## 2020-04-05 NOTE — Telephone Encounter (Signed)
Spoke to patient about how the monitor is processed. During this conversation the patient let me know she still had the equipment at her home. Advised to mail equipment back in to start processing.  Patient verbalized understanding.

## 2020-04-05 NOTE — Telephone Encounter (Signed)
Patient states [employer] told her to call Dr. Jackalyn Lombard office to get her heart monitor results. She states the monitor was ordered for a month, but she only wore it for a week.

## 2020-04-12 ENCOUNTER — Other Ambulatory Visit: Payer: Self-pay

## 2020-04-12 ENCOUNTER — Other Ambulatory Visit: Payer: Self-pay | Admitting: Diagnostic Neuroimaging

## 2020-04-12 ENCOUNTER — Ambulatory Visit: Payer: Medicare Other

## 2020-04-12 DIAGNOSIS — R2681 Unsteadiness on feet: Secondary | ICD-10-CM | POA: Diagnosis not present

## 2020-04-12 DIAGNOSIS — R2689 Other abnormalities of gait and mobility: Secondary | ICD-10-CM

## 2020-04-12 DIAGNOSIS — M25551 Pain in right hip: Secondary | ICD-10-CM | POA: Diagnosis not present

## 2020-04-12 DIAGNOSIS — M6281 Muscle weakness (generalized): Secondary | ICD-10-CM | POA: Diagnosis not present

## 2020-04-12 DIAGNOSIS — I63522 Cerebral infarction due to unspecified occlusion or stenosis of left anterior cerebral artery: Secondary | ICD-10-CM

## 2020-04-12 DIAGNOSIS — I4891 Unspecified atrial fibrillation: Secondary | ICD-10-CM

## 2020-04-12 DIAGNOSIS — I69353 Hemiplegia and hemiparesis following cerebral infarction affecting right non-dominant side: Secondary | ICD-10-CM | POA: Diagnosis not present

## 2020-04-12 NOTE — Patient Instructions (Signed)
Access Code: [email or web address] URL: [email or web address] Date: 04/12/2020 Prepared by: Cherly Anderson  Exercises Clamshell - 2 x daily - 5 x weekly - 2 sets - 10 reps Bent Knee Fallouts - 2 x daily - 5 x weekly - 2 sets - 10 reps Supine March - 2 x daily - 5 x weekly - 2 sets - 10 reps Walking March - 2 x daily - 5 x weekly - 1 sets - 3-4 reps Backward Walking with Counter Support - 2 x daily - 5 x weekly - 1 sets - 3-4 reps Side Stepping with Counter Support - 2 x daily - 5 x weekly - 1 sets - 3-4 reps

## 2020-04-12 NOTE — Therapy (Signed)
[facility] [address] [place], [place], [address] Phone: [phone]   Fax:  [phone]  Physical Therapy Treatment  Patient Details  Name: Melanie Chen MRN: [id number] Date of Birth: 1950-05-13 Referring Provider (PT): Penni Bombard, MD   Encounter Date: 04/12/2020   PT End of Session - 04/12/20 1109    Visit Number 2    Number of Visits 9    Date for PT Re-Evaluation 06/04/20    Authorization Type Federal BCBS and Medicare; VL PT/OT/ST: 75; 10th visit PN    Progress Note Due on Visit 10    PT Start Time 1105    PT Stop Time 1145    PT Time Calculation (min) 40 min    Equipment Utilized During Treatment Gait belt    Activity Tolerance Patient tolerated treatment well    Behavior During Therapy WFL for tasks assessed/performed           Past Medical History:  Diagnosis Date  . Colon polyp   . Depression   . Dizziness 09/10/14  . Hemorrhoids    pt unsure  . Osteoporosis     Past Surgical History:  Procedure Laterality Date  . COLONOSCOPY  09/2000  . COLONOSCOPY WITH PROPOFOL N/A 09/22/2019   Procedure: COLONOSCOPY WITH PROPOFOL;  Surgeon: Clarene Essex, MD;  Location: [facility];  Service: Endoscopy;  Laterality: N/A;  . HOT HEMOSTASIS N/A 09/22/2019   Procedure: HOT HEMOSTASIS (ARGON PLASMA COAGULATION/BICAP);  Surgeon: Clarene Essex, MD;  Location: [facility];  Service: Endoscopy;  Laterality: N/A;  . POLYPECTOMY  09/22/2019   Procedure: POLYPECTOMY;  Surgeon: Clarene Essex, MD;  Location: [facility];  Service: Endoscopy;;  . TUBAL LIGATION  1983    There were no vitals filed for this visit.   Subjective Assessment - 04/12/20 1109    Subjective Pt reports that she has noticed pain in right groin area especially when she brings right leg out to the side. Has been trying to use cane on the left more.    Pertinent History colon polyp, depression, dizziness, osteoporosis    Limitations Walking     Patient Stated Goals Higher energy level; return to driving, RLE to be stronger    Currently in Pain? Yes    Pain Score 1     Pain Location Hip    Pain Orientation Right    Pain Descriptors / Indicators Aching;Sharp    Pain Type Acute pain    Pain Onset More than a month ago    Pain Frequency Intermittent    Aggravating Factors  bringing leg out to the side.                             [place] Adult PT Treatment/Exercise - 04/12/20 1111      Ambulation/Gait   Ambulation/Gait Yes    Ambulation/Gait Assistance 5: Supervision    Ambulation/Gait Assistance Details Pt was cued to try to increase step length some and tighten right bottom during stance to prevent left hip drop. Pt was also cued to try to increase right hip flexion.    Ambulation Distance (Feet) 230 Feet    Assistive device Straight cane    Gait Pattern Step-through pattern;Decreased step length - right;Decreased stance time - right;Decreased hip/knee flexion - right;Decreased dorsiflexion - right    Ambulation Surface Level;Indoor      Exercises   Exercises Other Exercises    Other Exercises  Supine bridges x 10 with verbal cues to keep pelvis level, transverse abdominus(TA) contraction with unilateral bent knee fall out x 10 then bilateral bent knee fall out x 10, hooklying marching x 10 with TA contraction, left sidelying for right clamshell x 10. Pt denied any pain in right groin with these activities. A little soreness in right low back so instructed to be sure to focus on keeping tummy tight throughout movement. At counter: side stepping 8' x 6 with UE support, walking along counter with LUE support with focus on keeping pelvis level then backwards gait 8' x 6, then switched to marching forwards then backwards gait 8' x6.                  PT Education - 04/12/20 1219    Education Details Initial HEP    Person(s) Educated Patient    Methods Explanation;Demonstration;Handout    Comprehension  Verbalized understanding;Returned demonstration            PT Short Term Goals - 04/05/20 0559      PT SHORT TERM GOAL #1   Title Pt will demonstrate independence with initial HEP and will report return to home yoga practice 2x/week    Time 4    Period Weeks    Status New    Target Date 05/05/20      PT SHORT TERM GOAL #2   Title Pt will improve 30 second sit to stand to 11 reps    Baseline 9 reps    Time 4    Period Weeks    Status New    Target Date 05/05/20      PT SHORT TERM GOAL #3   Title Pt will improve DGI by 4 points to indicate decreased falls risk    Baseline 13/24    Time 4    Period Weeks    Status New    Target Date 05/05/20      PT SHORT TERM GOAL #4   Title Pt will increase gait velocity to >/= 2.9 ft/sec (or 0.9 m/sec)    Baseline .76 m/sec (2.5 ft/sec)    Time 4    Period Weeks    Status New    Target Date 05/05/20      PT SHORT TERM GOAL #5   Title Pt will negotiate 12 stairs with one rail, alternating sequence with supervision    Baseline pt has basement garage-climbs 12 to main level + 12 to upstairs bedroom    Time 4    Period Weeks    Status New    Target Date 05/05/20             PT Long Term Goals - 04/05/20 0607      PT LONG TERM GOAL #1   Title Pt will be independent with final HEP and will report full return to home yoga    Time 8    Period Weeks    Status New    Target Date 06/04/20      PT LONG TERM GOAL #2   Title Pt will report improvement in LE FOTO to >/= 74%    Baseline 62%    Time 8    Period Weeks    Status New    Target Date 06/04/20      PT LONG TERM GOAL #3   Title Pt will improve 30 second sit to stand to >/= 14 reps to indicate improved functional LE strength    Time 8  Period Weeks    Status New    Target Date 06/04/20      PT LONG TERM GOAL #4   Title Pt will improve DGI to >/= 20/24 to indicate decreased falls risk    Time 8    Period Weeks    Status New    Target Date 06/04/20      PT  LONG TERM GOAL #5   Title Pt will improve gait velocity without cane to >/= 3.2 ft/sec (or 1 m/sec)    Time 8    Period Weeks    Status New    Target Date 06/04/20      Additional Long Term Goals   Additional Long Term Goals Yes      PT LONG TERM GOAL #6   Title Pt will demonstrate ability to negotiate 2 flights of stairs (24) with 1 or no rail, alternating sequence, MOD I    Time 8    Period Weeks    Status New    Target Date 06/04/20      PT LONG TERM GOAL #7   Title Pt will initiate return to driving with supervised driving short distances near her home.    Time 8    Period Weeks    Status New    Target Date 06/04/20                 Plan - 04/12/20 1219    Clinical Impression Statement PT focused on establishing initial HEP for RLE strengthening today. Pt had reported feeling pain in right hip with bringing leg out to the side but did better during session today with no significant pain with activities. Focused on slow, controlled movements in pain free range and engaging core.    Personal Factors and Comorbidities Comorbidity 3+    Comorbidities depression, dizziness/vertigo, osteoporosis    Examination-Activity Limitations Carry;Locomotion Level;Stairs    Examination-Participation Restrictions Community Activity;Driving;Volunteer    Stability/Clinical Decision Making Stable/Uncomplicated    Rehab Potential Good    PT Frequency 1x / week    PT Duration 8 weeks    PT Treatment/Interventions ADLs/Self Care Home Management;Aquatic Therapy;Canalith Repostioning;Electrical Stimulation;DME Instruction;Gait training;Stair training;Functional mobility training;Therapeutic activities;Therapeutic exercise;Balance training;Neuromuscular re-education;Patient/family education;Orthotic Fit/Training;Manual techniques;Passive range of motion;Dry needling;Moist Heat;Cryotherapy;Vestibular    PT Next Visit Plan How is initial HEP going? Stll having right hip pain?  Add some balance  activities to HEP and focus on improving right foot clearance perhaps with stepping over some obstacles.  Progress gait to no cane as able.  She would like to return to home Yoga practice.    Consulted and Agree with Plan of Care Patient           Patient will benefit from skilled therapeutic intervention in order to improve the following deficits and impairments:  Abnormal gait,Decreased balance,Decreased strength,Difficulty walking,Pain  Visit Diagnosis: Other abnormalities of gait and mobility  Muscle weakness (generalized)  Unsteadiness on feet     Problem List Patient Active Problem List   Diagnosis Date Noted  . Cerebral thrombosis with cerebral infarction 02/11/2020  . Acute focal neurologic deficit with partial resolution 02/10/2020  . Depression 02/10/2020  . Osteoporosis 02/10/2020    Electa Sniff, PT, DPT, NCS 04/12/2020, 12:24 PM  [facility] [address] [place], [place], [address] Phone: [phone]   Fax:  [phone]  Name: Melanie Chen MRN: [id number] Date of Birth: 30-May-1950

## 2020-04-19 ENCOUNTER — Ambulatory Visit: Payer: Medicare Other

## 2020-04-19 ENCOUNTER — Other Ambulatory Visit: Payer: Self-pay

## 2020-04-19 DIAGNOSIS — M6281 Muscle weakness (generalized): Secondary | ICD-10-CM | POA: Diagnosis not present

## 2020-04-19 DIAGNOSIS — R2689 Other abnormalities of gait and mobility: Secondary | ICD-10-CM | POA: Diagnosis not present

## 2020-04-19 DIAGNOSIS — R2681 Unsteadiness on feet: Secondary | ICD-10-CM

## 2020-04-19 DIAGNOSIS — I69353 Hemiplegia and hemiparesis following cerebral infarction affecting right non-dominant side: Secondary | ICD-10-CM | POA: Diagnosis not present

## 2020-04-19 DIAGNOSIS — M25551 Pain in right hip: Secondary | ICD-10-CM | POA: Diagnosis not present

## 2020-04-19 NOTE — Therapy (Signed)
[facility] [address] [place], [place], [address] Phone: [phone]   Fax:  [phone]  Physical Therapy Treatment  Patient Details  Name: Melanie Chen MRN: [id number] Date of Birth: 1951/02/07 Referring Provider (PT): Penni Bombard, MD   Encounter Date: 04/19/2020   PT End of Session - 04/19/20 1329    Visit Number 3    Number of Visits 9    Date for PT Re-Evaluation 06/04/20    Authorization Type Federal BCBS and Medicare; VL PT/OT/ST: 75; 10th visit PN    Progress Note Due on Visit 10    PT Start Time 1145    PT Stop Time 1230    PT Time Calculation (min) 45 min    Equipment Utilized During Treatment Gait belt    Activity Tolerance Patient tolerated treatment well    Behavior During Therapy WFL for tasks assessed/performed           Past Medical History:  Diagnosis Date  . Colon polyp   . Depression   . Dizziness 09/10/14  . Hemorrhoids    pt unsure  . Osteoporosis     Past Surgical History:  Procedure Laterality Date  . COLONOSCOPY  09/2000  . COLONOSCOPY WITH PROPOFOL N/A 09/22/2019   Procedure: COLONOSCOPY WITH PROPOFOL;  Surgeon: Clarene Essex, MD;  Location: [facility];  Service: Endoscopy;  Laterality: N/A;  . HOT HEMOSTASIS N/A 09/22/2019   Procedure: HOT HEMOSTASIS (ARGON PLASMA COAGULATION/BICAP);  Surgeon: Clarene Essex, MD;  Location: [facility];  Service: Endoscopy;  Laterality: N/A;  . POLYPECTOMY  09/22/2019   Procedure: POLYPECTOMY;  Surgeon: Clarene Essex, MD;  Location: [facility];  Service: Endoscopy;;  . TUBAL LIGATION  1983    There were no vitals filed for this visit.   Subjective Assessment - 04/19/20 1156    Subjective continues to report R hip groin pain with abduction, most prominent when getting into car    Pertinent History colon polyp, depression, dizziness, osteoporosis    Limitations Walking    Patient Stated Goals Higher energy level; return to driving,  RLE to be stronger    Currently in Pain? Yes    Pain Score 7     Pain Location Hip    Pain Type Acute pain    Pain Onset More than a month ago                             [facility] Adult PT Treatment/Exercise - 04/19/20 0001      Transfers   Transfers Sit to Stand    Comments perored 5x arms crossed solid surface, 10x on airex usingUEs as needd      Ambulation/Gait   Ambulation/Gait Yes    Ambulation/Gait Assistance 5: Supervision    Ambulation/Gait Assistance Details performed steppin over orange stripes with RLE to encourage increased step length    Ambulation Distance (Feet) 345 Feet    Assistive device Straight cane    Gait Pattern Step-through pattern    Ambulation Surface Level;Indoor               Balance Exercises - 04/19/20 0001      Balance Exercises: Standing   Tandem Gait Forward;Intermittent upper extremity support;5 reps;Other (comment)    Tandem Gait Limitations performed against greem band 5 trips down counter    Retro Gait Upper extremity support;5 reps;Theraband;Limitations    Theraband Level (Retro Gait) Level 3 (Green)  Retro Gait Limitations 5 trips down counter    Sidestepping Upper extremity support;5 reps;Theraband;Limitations    Theraband Level (Sidestepping) Level 3 (Green)    Sidestepping Limitations 5 trips down counter    Other Standing Exercises SLS on 4' step for opposite LE kicking against green band, 2x15 per LE               PT Short Term Goals - 04/05/20 0559      PT SHORT TERM GOAL #1   Title Pt will demonstrate independence with initial HEP and will report return to home yoga practice 2x/week    Time 4    Period Weeks    Status New    Target Date 05/05/20      PT SHORT TERM GOAL #2   Title Pt will improve 30 second sit to stand to 11 reps    Baseline 9 reps    Time 4    Period Weeks    Status New    Target Date 05/05/20      PT SHORT TERM GOAL #3   Title Pt will improve DGI by 4 points to  indicate decreased falls risk    Baseline 13/24    Time 4    Period Weeks    Status New    Target Date 05/05/20      PT SHORT TERM GOAL #4   Title Pt will increase gait velocity to >/= 2.9 ft/sec (or 0.9 m/sec)    Baseline .76 m/sec (2.5 ft/sec)    Time 4    Period Weeks    Status New    Target Date 05/05/20      PT SHORT TERM GOAL #5   Title Pt will negotiate 12 stairs with one rail, alternating sequence with supervision    Baseline pt has basement garage-climbs 12 to main level + 12 to upstairs bedroom    Time 4    Period Weeks    Status New    Target Date 05/05/20             PT Long Term Goals - 04/05/20 0607      PT LONG TERM GOAL #1   Title Pt will be independent with final HEP and will report full return to home yoga    Time 8    Period Weeks    Status New    Target Date 06/04/20      PT LONG TERM GOAL #2   Title Pt will report improvement in LE FOTO to >/= 74%    Baseline 62%    Time 8    Period Weeks    Status New    Target Date 06/04/20      PT LONG TERM GOAL #3   Title Pt will improve 30 second sit to stand to >/= 14 reps to indicate improved functional LE strength    Time 8    Period Weeks    Status New    Target Date 06/04/20      PT LONG TERM GOAL #4   Title Pt will improve DGI to >/= 20/24 to indicate decreased falls risk    Time 8    Period Weeks    Status New    Target Date 06/04/20      PT LONG TERM GOAL #5   Title Pt will improve gait velocity without cane to >/= 3.2 ft/sec (or 1 m/sec)    Time 8    Period Weeks    Status  New    Target Date 06/04/20      Additional Long Term Goals   Additional Long Term Goals Yes      PT LONG TERM GOAL #6   Title Pt will demonstrate ability to negotiate 2 flights of stairs (24) with 1 or no rail, alternating sequence, MOD I    Time 8    Period Weeks    Status New    Target Date 06/04/20      PT LONG TERM GOAL #7   Title Pt will initiate return to driving with supervised driving short  distances near her home.    Time 8    Period Weeks    Status New    Target Date 06/04/20                 Plan - 04/19/20 1330    Clinical Impression Statement Some missteps noted todaay with gait with cane due to inattention, used clinic cane adjusted for her, performe stepping over orange stripes with RLE to increase step length, added sidestepping, tandem walking and retrowalking against t-band to promote hip/glute strengthening    Personal Factors and Comorbidities Comorbidity 3+    Comorbidities depression, dizziness/vertigo, osteoporosis    Examination-Activity Limitations Carry;Locomotion Level;Stairs    Examination-Participation Restrictions Community Activity;Driving;Volunteer    Stability/Clinical Decision Making Stable/Uncomplicated    Rehab Potential Good    PT Frequency 1x / week    PT Duration 8 weeks    PT Treatment/Interventions ADLs/Self Care Home Management;Aquatic Therapy;Canalith Repostioning;Electrical Stimulation;DME Instruction;Gait training;Stair training;Functional mobility training;Therapeutic activities;Therapeutic exercise;Balance training;Neuromuscular re-education;Patient/family education;Orthotic Fit/Training;Manual techniques;Passive range of motion;Dry needling;Moist Heat;Cryotherapy;Vestibular    PT Next Visit Plan f/u wit R groin pain, ambulation with shorter cane, add stepping and balance tasks, continue hip/glute strengthening    PT Home Exercise Plan sidestepping against green band    Consulted and Agree with Plan of Care Patient           Patient will benefit from skilled therapeutic intervention in order to improve the following deficits and impairments:  Abnormal gait,Decreased balance,Decreased strength,Difficulty walking,Pain  Visit Diagnosis: Unsteadiness on feet  Muscle weakness (generalized)     Problem List Patient Active Problem List   Diagnosis Date Noted  . Cerebral thrombosis with cerebral infarction 02/11/2020  . Acute  focal neurologic deficit with partial resolution 02/10/2020  . Depression 02/10/2020  . Osteoporosis 02/10/2020    Lanice Shirts PT 04/19/2020, 1:36 PM  [facility] [address] [place], [place], [address] Phone: [phone]   Fax:  [phone]  Name: DAYANI WINBUSH MRN: [id number] Date of Birth: Jul 21, 1950

## 2020-04-19 NOTE — Patient Instructions (Signed)
Access Code: [email or web address] URL: [email or web address] Date: 04/19/2020 Prepared by: Sharlynn Oliphant  Exercises Side Stepping with Resistance at Thighs - 1 x daily - 5 x weekly - 3 sets - 10 reps

## 2020-04-26 ENCOUNTER — Other Ambulatory Visit: Payer: Self-pay

## 2020-04-26 ENCOUNTER — Ambulatory Visit: Payer: Medicare Other | Attending: Diagnostic Neuroimaging

## 2020-04-26 DIAGNOSIS — M6281 Muscle weakness (generalized): Secondary | ICD-10-CM | POA: Insufficient documentation

## 2020-04-26 DIAGNOSIS — R2681 Unsteadiness on feet: Secondary | ICD-10-CM | POA: Diagnosis not present

## 2020-04-26 DIAGNOSIS — R2689 Other abnormalities of gait and mobility: Secondary | ICD-10-CM | POA: Insufficient documentation

## 2020-04-26 NOTE — Patient Instructions (Signed)
Access Code: [email or web address] URL: [email or web address] Date: 04/26/2020 Prepared by: Cherly Anderson  Exercises Clamshell - 2 x daily - 5 x weekly - 2 sets - 10 reps Bent Knee Fallouts - 2 x daily - 5 x weekly - 2 sets - 10 reps Supine March - 2 x daily - 5 x weekly - 2 sets - 10 reps Walking March - 2 x daily - 5 x weekly - 1 sets - 3-4 reps Backward Walking with Counter Support - 2 x daily - 5 x weekly - 1 sets - 3-4 reps Side Stepping with Counter Support - 2 x daily - 5 x weekly - 1 sets - 3-4 reps Standing head turns - 2 x daily - 7 x weekly - 2 sets - 10 reps

## 2020-04-26 NOTE — Therapy (Signed)
[facility] [address] [place], [place], [address] Phone: [phone]   Fax:  [phone]  Physical Therapy Treatment  Patient Details  Name: Melanie Chen MRN: [id number] Date of Birth: 06-May-1950 Referring Provider (PT): Penni Bombard, MD   Encounter Date: 04/26/2020   PT End of Session - 04/26/20 1108    Visit Number 4    Number of Visits 9    Date for PT Re-Evaluation 06/04/20    Authorization Type Federal BCBS and Medicare; VL PT/OT/ST: 75; 10th visit PN    Progress Note Due on Visit 10    PT Start Time 1105    PT Stop Time 1149    PT Time Calculation (min) 44 min    Equipment Utilized During Treatment Gait belt    Activity Tolerance Patient tolerated treatment well    Behavior During Therapy WFL for tasks assessed/performed           Past Medical History:  Diagnosis Date  . Colon polyp   . Depression   . Dizziness 09/10/14  . Hemorrhoids    pt unsure  . Osteoporosis     Past Surgical History:  Procedure Laterality Date  . COLONOSCOPY  09/2000  . COLONOSCOPY WITH PROPOFOL N/A 09/22/2019   Procedure: COLONOSCOPY WITH PROPOFOL;  Surgeon: Clarene Essex, MD;  Location: [facility];  Service: Endoscopy;  Laterality: N/A;  . HOT HEMOSTASIS N/A 09/22/2019   Procedure: HOT HEMOSTASIS (ARGON PLASMA COAGULATION/BICAP);  Surgeon: Clarene Essex, MD;  Location: [facility];  Service: Endoscopy;  Laterality: N/A;  . POLYPECTOMY  09/22/2019   Procedure: POLYPECTOMY;  Surgeon: Clarene Essex, MD;  Location: [facility];  Service: Endoscopy;;  . TUBAL LIGATION  1983    There were no vitals filed for this visit.   Subjective Assessment - 04/26/20 1108    Subjective Pt reports that she is doing pretty good. She does continue to have the right hip/groin pain. Had some last night that went down to knee 3/10.    Pertinent History colon polyp, depression, dizziness, osteoporosis    Limitations Walking    Patient  Stated Goals Higher energy level; return to driving, RLE to be stronger    Currently in Pain? No/denies    Pain Onset More than a month ago                             [facility] Adult PT Treatment/Exercise - 04/26/20 1109      Ambulation/Gait   Ambulation/Gait Yes    Ambulation/Gait Assistance 5: Supervision;4: Min guard    Ambulation/Gait Assistance Details Pt had more trouble with RLE as fatigued with more noted trendelenberg. Pt was cued to focus on tightening bottom during right stance to help. Does stagger at times with gait.    Ambulation Distance (Feet) 850 Feet    Assistive device Straight cane    Gait Pattern Step-through pattern;Decreased stance time - right;Trendelenburg    Ambulation Surface Level;Indoor      Neuro Re-ed    Neuro Re-ed Details  Reciprocal stepping over 3 cones on blue mat tapping cone with each foot first x 6 bouts with CGA/min assist at times for stability with use of SPC. Dynamic gait activities in hallway: gait with head turns left/right and up/down 40' x 2 each. Pt needed CGA/min assist at times with looking left/right with increased stagger. Standing in corner: feet together eyes open x 30 sec then added in  horizontal head turns x 10, on pillow with feet apart with head turns x 10 side to side with increased sway.                  PT Education - 04/26/20 1438    Education Details Added corner balance activity to HEP    Person(s) Educated Patient    Methods Explanation;Demonstration;Handout    Comprehension Verbalized understanding;Returned demonstration            PT Short Term Goals - 04/05/20 0559      PT SHORT TERM GOAL #1   Title Pt will demonstrate independence with initial HEP and will report return to home yoga practice 2x/week    Time 4    Period Weeks    Status New    Target Date 05/05/20      PT SHORT TERM GOAL #2   Title Pt will improve 30 second sit to stand to 11 reps    Baseline 9 reps    Time 4     Period Weeks    Status New    Target Date 05/05/20      PT SHORT TERM GOAL #3   Title Pt will improve DGI by 4 points to indicate decreased falls risk    Baseline 13/24    Time 4    Period Weeks    Status New    Target Date 05/05/20      PT SHORT TERM GOAL #4   Title Pt will increase gait velocity to >/= 2.9 ft/sec (or 0.9 m/sec)    Baseline .76 m/sec (2.5 ft/sec)    Time 4    Period Weeks    Status New    Target Date 05/05/20      PT SHORT TERM GOAL #5   Title Pt will negotiate 12 stairs with one rail, alternating sequence with supervision    Baseline pt has basement garage-climbs 12 to main level + 12 to upstairs bedroom    Time 4    Period Weeks    Status New    Target Date 05/05/20             PT Long Term Goals - 04/05/20 0607      PT LONG TERM GOAL #1   Title Pt will be independent with final HEP and will report full return to home yoga    Time 8    Period Weeks    Status New    Target Date 06/04/20      PT LONG TERM GOAL #2   Title Pt will report improvement in LE FOTO to >/= 74%    Baseline 62%    Time 8    Period Weeks    Status New    Target Date 06/04/20      PT LONG TERM GOAL #3   Title Pt will improve 30 second sit to stand to >/= 14 reps to indicate improved functional LE strength    Time 8    Period Weeks    Status New    Target Date 06/04/20      PT LONG TERM GOAL #4   Title Pt will improve DGI to >/= 20/24 to indicate decreased falls risk    Time 8    Period Weeks    Status New    Target Date 06/04/20      PT LONG TERM GOAL #5   Title Pt will improve gait velocity without cane to >/= 3.2 ft/sec (or  1 m/sec)    Time 8    Period Weeks    Status New    Target Date 06/04/20      Additional Long Term Goals   Additional Long Term Goals Yes      PT LONG TERM GOAL #6   Title Pt will demonstrate ability to negotiate 2 flights of stairs (24) with 1 or no rail, alternating sequence, MOD I    Time 8    Period Weeks    Status New     Target Date 06/04/20      PT LONG TERM GOAL #7   Title Pt will initiate return to driving with supervised driving short distances near her home.    Time 8    Period Weeks    Status New    Target Date 06/04/20                 Plan - 04/26/20 1439    Clinical Impression Statement Pt with decreased stability as fatigued with longer gait distance with more weakness noted in RLE. Pt reports she has not walked that far since her CVA. Pt was challenged with head turns with gait and in corner with increased sway.    Personal Factors and Comorbidities Comorbidity 3+    Comorbidities depression, dizziness/vertigo, osteoporosis    Examination-Activity Limitations Carry;Locomotion Level;Stairs    Examination-Participation Restrictions Community Activity;Driving;Volunteer    Stability/Clinical Decision Making Stable/Uncomplicated    Rehab Potential Good    PT Frequency 1x / week    PT Duration 8 weeks    PT Treatment/Interventions ADLs/Self Care Home Management;Aquatic Therapy;Canalith Repostioning;Electrical Stimulation;DME Instruction;Gait training;Stair training;Functional mobility training;Therapeutic activities;Therapeutic exercise;Balance training;Neuromuscular re-education;Patient/family education;Orthotic Fit/Training;Manual techniques;Passive range of motion;Dry needling;Moist Heat;Cryotherapy;Vestibular    PT Next Visit Plan Check STGs. ambulation with shorter cane, add stepping and balance tasks, continue hip/glute strengthening    PT Home Exercise Plan sidestepping against green band    Consulted and Agree with Plan of Care Patient           Patient will benefit from skilled therapeutic intervention in order to improve the following deficits and impairments:  Abnormal gait,Decreased balance,Decreased strength,Difficulty walking,Pain  Visit Diagnosis: Other abnormalities of gait and mobility  Muscle weakness (generalized)     Problem List Patient Active Problem List    Diagnosis Date Noted  . Cerebral thrombosis with cerebral infarction 02/11/2020  . Acute focal neurologic deficit with partial resolution 02/10/2020  . Depression 02/10/2020  . Osteoporosis 02/10/2020    Electa Sniff, PT, DPT, NCS 04/26/2020, 2:42 PM  [facility] [address] [place], [place], [address] Phone: [phone]   Fax:  [phone]  Name: Melanie Chen MRN: [id number] Date of Birth: 10/03/1950

## 2020-05-03 ENCOUNTER — Ambulatory Visit: Payer: Medicare Other

## 2020-05-03 ENCOUNTER — Other Ambulatory Visit: Payer: Self-pay

## 2020-05-03 DIAGNOSIS — R2689 Other abnormalities of gait and mobility: Secondary | ICD-10-CM | POA: Diagnosis not present

## 2020-05-03 DIAGNOSIS — M6281 Muscle weakness (generalized): Secondary | ICD-10-CM | POA: Diagnosis not present

## 2020-05-03 DIAGNOSIS — R2681 Unsteadiness on feet: Secondary | ICD-10-CM | POA: Diagnosis not present

## 2020-05-03 NOTE — Therapy (Signed)
[facility] [address] [place], [place], [address] Phone: [phone]   Fax:  [phone]  Physical Therapy Treatment  Patient Details  Name: Melanie Chen MRN: [id number] Date of Birth: Aug 08, 1950 Referring Provider (PT): Penni Bombard, MD   Encounter Date: 05/03/2020   PT End of Session - 05/03/20 1206    Visit Number 5    Number of Visits 9    Date for PT Re-Evaluation 06/04/20    Authorization Type Federal BCBS and Medicare; VL PT/OT/ST: 75; 10th visit PN    Progress Note Due on Visit 10    PT Start Time 1103    PT Stop Time 1145    PT Time Calculation (min) 42 min    Equipment Utilized During Treatment Gait belt    Activity Tolerance Patient tolerated treatment well    Behavior During Therapy WFL for tasks assessed/performed           Past Medical History:  Diagnosis Date  . Colon polyp   . Depression   . Dizziness 09/10/14  . Hemorrhoids    pt unsure  . Osteoporosis     Past Surgical History:  Procedure Laterality Date  . COLONOSCOPY  09/2000  . COLONOSCOPY WITH PROPOFOL N/A 09/22/2019   Procedure: COLONOSCOPY WITH PROPOFOL;  Surgeon: Clarene Essex, MD;  Location: [facility];  Service: Endoscopy;  Laterality: N/A;  . HOT HEMOSTASIS N/A 09/22/2019   Procedure: HOT HEMOSTASIS (ARGON PLASMA COAGULATION/BICAP);  Surgeon: Clarene Essex, MD;  Location: [facility];  Service: Endoscopy;  Laterality: N/A;  . POLYPECTOMY  09/22/2019   Procedure: POLYPECTOMY;  Surgeon: Clarene Essex, MD;  Location: [facility];  Service: Endoscopy;;  . TUBAL LIGATION  1983    There were no vitals filed for this visit.   Subjective Assessment - 05/03/20 1105    Subjective Pt reports doing home yoga prior but has not started doing that again yet.  She is experinecing hip discomfort when performing clam shells at home most likely due to the surface she is lying on.  No complaints about any pain in R groin.    Pertinent  History colon polyp, depression, dizziness, osteoporosis    Limitations Walking    Patient Stated Goals Higher energy level; return to driving, RLE to be stronger    Currently in Pain? No/denies    Pain Onset --              [facility] PT Assessment - 05/03/20 1106      Standardized Balance Assessment   Five times sit to stand comments  30 sec STS; 11 reps.  Some reps in contact with posterior portion of legs.    10 Meter Walk 14.16 sec with SPC - 2.3 ft/sec      Dynamic Gait Index   Level Surface Mild Impairment    Change in Gait Speed Mild Impairment    Gait with Horizontal Head Turns Mild Impairment    Gait with Vertical Head Turns Mild Impairment    Gait and Pivot Turn Normal    Step Over Obstacle Mild Impairment    Step Around Obstacles Normal    Steps Mild Impairment    Total Score 18    DGI comment: SPC used                         [facility] Adult PT Treatment/Exercise - 05/03/20 1130      Ambulation/Gait   Gait Comments x4 laps  obstacle course walking with SPC; Marches over compliant surface, normal gait solid surface, normal gait over compliant surface, reciprocal stepping over 3 4in hurdles and 1 6in hurdle. Pt experienced fatigue following exercises but experienced no LOB      Exercises   Other Exercises  Tall kneeling on mat with stability ball; 10x roll outs, x10 alteranting shoulder raises, x10 hip thrust, x10 hip walks forward/backwards.  Pt experience significant stretch during hip hinge and R quad ache due to exertion.               Balance Exercises - 05/03/20 1147      Balance Exercises: Standing   SLS Solid surface   x10 touches per leg to object in front and 30 degrees from parallel.  R lateral hip lean noted during R standing.            PT Education - 05/03/20 1202    Education Details Short term goals, progress made with therapy, adding quadraped movements and more core challenging exercises    Person(s) Educated Patient     Methods Explanation    Comprehension Verbalized understanding            PT Short Term Goals - 05/03/20 1120      PT SHORT TERM GOAL #1   Title Pt will demonstrate independence with initial HEP and will report return to home yoga practice 2x/week    Baseline Pt reports performing exercises 4 out of the 5 days instructed. She denies any questions. Has not returned to doing her yoga yet.    Time 4    Period Weeks    Status Partially Met    Target Date 05/05/20      PT SHORT TERM GOAL #2   Title Pt will improve 30 second sit to stand to 11 reps    Baseline 9 reps; 11 reps 05/03/20    Time 4    Period Weeks    Status Achieved    Target Date 05/05/20      PT SHORT TERM GOAL #3   Title Pt will improve DGI by 4 points to indicate decreased falls risk    Baseline 13/24; 18/24 05/03/20    Time 4    Period Weeks    Status Achieved    Target Date 05/05/20      PT SHORT TERM GOAL #4   Title Pt will increase gait velocity to >/= 2.9 ft/sec (or 0.9 m/sec)    Baseline .76 m/sec (2.5 ft/sec); 2.3 ft/sec 05/03/20    Time 4    Period Weeks    Status Not Met    Target Date 05/05/20      PT SHORT TERM GOAL #5   Title Pt will negotiate 12 stairs with one rail, alternating sequence with supervision    Baseline pt has basement garage-climbs 12 to main level + 12 to upstairs bedroom    Time 4    Period Weeks    Status Achieved    Target Date 05/05/20             PT Long Term Goals - 04/05/20 0607      PT LONG TERM GOAL #1   Title Pt will be independent with final HEP and will report full return to home yoga    Time 8    Period Weeks    Status New    Target Date 06/04/20      PT LONG TERM GOAL #2   Title Pt  will report improvement in LE FOTO to >/= 74%    Baseline 62%    Time 8    Period Weeks    Status New    Target Date 06/04/20      PT LONG TERM GOAL #3   Title Pt will improve 30 second sit to stand to >/= 14 reps to indicate improved functional LE strength    Time 8     Period Weeks    Status New    Target Date 06/04/20      PT LONG TERM GOAL #4   Title Pt will improve DGI to >/= 20/24 to indicate decreased falls risk    Time 8    Period Weeks    Status New    Target Date 06/04/20      PT LONG TERM GOAL #5   Title Pt will improve gait velocity without cane to >/= 3.2 ft/sec (or 1 m/sec)    Time 8    Period Weeks    Status New    Target Date 06/04/20      Additional Long Term Goals   Additional Long Term Goals Yes      PT LONG TERM GOAL #6   Title Pt will demonstrate ability to negotiate 2 flights of stairs (24) with 1 or no rail, alternating sequence, MOD I    Time 8    Period Weeks    Status New    Target Date 06/04/20      PT LONG TERM GOAL #7   Title Pt will initiate return to driving with supervised driving short distances near her home.    Time 8    Period Weeks    Status New    Target Date 06/04/20                 Plan - 05/03/20 1208    Clinical Impression Statement Pt achieved most of her short term goals.  No significant improvements noted in gait speed, but her DGI improved and she demonstarated ability to negotiate 12 stairs with reciprocal gait pattern one hand rail and SPC.  Included exercises to challenge hip and core stability.  Obstacle course walking was challenging but pt able to correctly negotiate all items with supervision and SPC.  She experienced high amounts of fatigue following session but did not display any LOB.    Personal Factors and Comorbidities Comorbidity 3+    Comorbidities depression, dizziness/vertigo, osteoporosis    Examination-Activity Limitations Carry;Locomotion Level;Stairs    Examination-Participation Restrictions Community Activity;Driving;Volunteer    Stability/Clinical Decision Making Stable/Uncomplicated    Rehab Potential Good    PT Frequency 1x / week    PT Duration 8 weeks    PT Treatment/Interventions ADLs/Self Care Home Management;Aquatic Therapy;Canalith  Repostioning;Electrical Stimulation;DME Instruction;Gait training;Stair training;Functional mobility training;Therapeutic activities;Therapeutic exercise;Balance training;Neuromuscular re-education;Patient/family education;Orthotic Fit/Training;Manual techniques;Passive range of motion;Dry needling;Moist Heat;Cryotherapy;Vestibular    PT Next Visit Plan Quadraped exercises, continue stepping and balance tasks, continue hip/glute strengthening    Consulted and Agree with Plan of Care Patient           Patient will benefit from skilled therapeutic intervention in order to improve the following deficits and impairments:  Abnormal gait,Decreased balance,Decreased strength,Difficulty walking,Pain  Visit Diagnosis: Other abnormalities of gait and mobility  Muscle weakness (generalized)     Problem List Patient Active Problem List   Diagnosis Date Noted  . Cerebral thrombosis with cerebral infarction 02/11/2020  . Acute focal neurologic deficit with partial resolution 02/10/2020  .  Depression 02/10/2020  . Osteoporosis 02/10/2020    Yetta Numbers, SPT 05/03/2020, 2:42 PM  [facility] [address] [place], [place], [address] Phone: [phone]   Fax:  [phone]  Name: JACQUALYNN PARCO MRN: [id number] Date of Birth: April 22, 1950

## 2020-05-10 ENCOUNTER — Other Ambulatory Visit: Payer: Self-pay

## 2020-05-10 ENCOUNTER — Ambulatory Visit: Payer: Medicare Other

## 2020-05-10 DIAGNOSIS — R2689 Other abnormalities of gait and mobility: Secondary | ICD-10-CM | POA: Diagnosis not present

## 2020-05-10 DIAGNOSIS — R2681 Unsteadiness on feet: Secondary | ICD-10-CM | POA: Diagnosis not present

## 2020-05-10 DIAGNOSIS — M6281 Muscle weakness (generalized): Secondary | ICD-10-CM

## 2020-05-10 NOTE — Therapy (Signed)
[facility] [address] [place], [place], [address] Phone: [phone]   Fax:  [phone]  Physical Therapy Treatment  Patient Details  Name: Melanie Chen MRN: [id number] Date of Birth: 10/15/1950 Referring Provider (PT): Penni Bombard, MD   Encounter Date: 05/10/2020   PT End of Session - 05/10/20 1208    Visit Number 6    Number of Visits 9    Date for PT Re-Evaluation 06/04/20    Authorization Type Federal BCBS and Medicare; VL PT/OT/ST: 75; 10th visit PN    Progress Note Due on Visit 10    PT Start Time 1101    PT Stop Time 1143    PT Time Calculation (min) 42 min    Equipment Utilized During Treatment Gait belt    Activity Tolerance Patient tolerated treatment well    Behavior During Therapy WFL for tasks assessed/performed           Past Medical History:  Diagnosis Date  . Colon polyp   . Depression   . Dizziness 09/10/14  . Hemorrhoids    pt unsure  . Osteoporosis     Past Surgical History:  Procedure Laterality Date  . COLONOSCOPY  09/2000  . COLONOSCOPY WITH PROPOFOL N/A 09/22/2019   Procedure: COLONOSCOPY WITH PROPOFOL;  Surgeon: Clarene Essex, MD;  Location: [facility];  Service: Endoscopy;  Laterality: N/A;  . HOT HEMOSTASIS N/A 09/22/2019   Procedure: HOT HEMOSTASIS (ARGON PLASMA COAGULATION/BICAP);  Surgeon: Clarene Essex, MD;  Location: [facility];  Service: Endoscopy;  Laterality: N/A;  . POLYPECTOMY  09/22/2019   Procedure: POLYPECTOMY;  Surgeon: Clarene Essex, MD;  Location: [facility];  Service: Endoscopy;;  . TUBAL LIGATION  1983    There were no vitals filed for this visit.   Subjective Assessment - 05/10/20 1107    Subjective Pt reports she had a fall yesterday on her deck.  She was walking a few steps to get into the house without her cane and fell onto her R side.  She was able to get up from the ground without assistnace.  She did not think she needed to go to the doctor  but reports she is feeling sore.  She has a bruise on her left hip and glute.  She also reports that she was not able to sleep well last night.    Pertinent History colon polyp, depression, dizziness, osteoporosis    Limitations Walking    Patient Stated Goals Higher energy level; return to driving, RLE to be stronger    Currently in Pain? Yes    Pain Descriptors / Indicators Sore                             [facility] Adult PT Treatment/Exercise - 05/10/20 1159      Ambulation/Gait   Ambulation/Gait Yes    Ambulation/Gait Assistance 5: Supervision    Ambulation/Gait Assistance Details practicing turning on therapist command.  Slow turning with short staggered steps.    Ambulation Distance (Feet) 300 Feet    Assistive device Straight cane    Gait Pattern Step-through pattern    Ambulation Surface Level;Indoor      Exercises   Other Exercises  Tall kneeling with swiss ball. 2x10 roll outs, 2x10 alternating arm reachs, 2x10 hip hinges, 2x10 altnerating hip steps forward and bakwards.  On quadraped.  2x10 alternating arm reaches, 2x10 altnerating leg reaches, 2x10 hip abd.   Pt  had difficulty lifting R leg during backward portion of hip step exercises.  R quadraped hip abd limited range of motion and tactile cues to maintain neutral pelvis.              Balance Exercises - 05/10/20 1145      Balance Exercises: Standing   SLS Eyes open;Solid surface   x10 reps each, SPC, touching 3 cones placed vertical, 30 degrees off vertical and 45 degrees off vertical.  Difficulty reaching 45deg cone with R.  Required verbal cueing to maintain proper alignment when standing on R.   Step Ups Forward   SPC, to airex pad, 2x10 each leg   Turning --    Marching Solid surface;Forwards   4x24f marches wiht SPC followed by regular walking around track.            PT Education - 05/10/20 1207    Education Details Icing hip to help with pain and bruising    Person(s) Educated Patient     Methods Explanation    Comprehension Verbalized understanding            PT Short Term Goals - 05/03/20 1120      PT SHORT TERM GOAL #1   Title Pt will demonstrate independence with initial HEP and will report return to home yoga practice 2x/week    Baseline Pt reports performing exercises 4 out of the 5 days instructed. She denies any questions. Has not returned to doing her yoga yet.    Time 4    Period Weeks    Status Partially Met    Target Date 05/05/20      PT SHORT TERM GOAL #2   Title Pt will improve 30 second sit to stand to 11 reps    Baseline 9 reps; 11 reps 05/03/20    Time 4    Period Weeks    Status Achieved    Target Date 05/05/20      PT SHORT TERM GOAL #3   Title Pt will improve DGI by 4 points to indicate decreased falls risk    Baseline 13/24; 18/24 05/03/20    Time 4    Period Weeks    Status Achieved    Target Date 05/05/20      PT SHORT TERM GOAL #4   Title Pt will increase gait velocity to >/= 2.9 ft/sec (or 0.9 m/sec)    Baseline .76 m/sec (2.5 ft/sec); 2.3 ft/sec 05/03/20    Time 4    Period Weeks    Status Not Met    Target Date 05/05/20      PT SHORT TERM GOAL #5   Title Pt will negotiate 12 stairs with one rail, alternating sequence with supervision    Baseline pt has basement garage-climbs 12 to main level + 12 to upstairs bedroom    Time 4    Period Weeks    Status Achieved    Target Date 05/05/20             PT Long Term Goals - 04/05/20 0607      PT LONG TERM GOAL #1   Title Pt will be independent with final HEP and will report full return to home yoga    Time 8    Period Weeks    Status New    Target Date 06/04/20      PT LONG TERM GOAL #2   Title Pt will report improvement in LE FOTO to >/= 74%  Baseline 62%    Time 8    Period Weeks    Status New    Target Date 06/04/20      PT LONG TERM GOAL #3   Title Pt will improve 30 second sit to stand to >/= 14 reps to indicate improved functional LE strength    Time 8     Period Weeks    Status New    Target Date 06/04/20      PT LONG TERM GOAL #4   Title Pt will improve DGI to >/= 20/24 to indicate decreased falls risk    Time 8    Period Weeks    Status New    Target Date 06/04/20      PT LONG TERM GOAL #5   Title Pt will improve gait velocity without cane to >/= 3.2 ft/sec (or 1 m/sec)    Time 8    Period Weeks    Status New    Target Date 06/04/20      Additional Long Term Goals   Additional Long Term Goals Yes      PT LONG TERM GOAL #6   Title Pt will demonstrate ability to negotiate 2 flights of stairs (24) with 1 or no rail, alternating sequence, MOD I    Time 8    Period Weeks    Status New    Target Date 06/04/20      PT LONG TERM GOAL #7   Title Pt will initiate return to driving with supervised driving short distances near her home.    Time 8    Period Weeks    Status New    Target Date 06/04/20                 Plan - 05/10/20 1208    Clinical Impression Statement Pt continutes to make progress towards RLE strengthening, balance and aerobic conditioning.  She still displays significant weakness noted during stepping exercises and hip abd strengthening.  Pt requires verbal ceuing to maintain neutral alignment and having more slow controlled movements during single limb stance.  Pt was able to tolerate all exercises without causing further pain to her R hip where she fell.    Personal Factors and Comorbidities Comorbidity 3+    Comorbidities depression, dizziness/vertigo, osteoporosis    Examination-Activity Limitations Carry;Locomotion Level;Stairs    Examination-Participation Restrictions Community Activity;Driving;Volunteer    Stability/Clinical Decision Making Stable/Uncomplicated    Rehab Potential Good    PT Frequency 1x / week    PT Duration 8 weeks    PT Treatment/Interventions ADLs/Self Care Home Management;Aquatic Therapy;Canalith Repostioning;Electrical Stimulation;DME Instruction;Gait training;Stair  training;Functional mobility training;Therapeutic activities;Therapeutic exercise;Balance training;Neuromuscular re-education;Patient/family education;Orthotic Fit/Training;Manual techniques;Passive range of motion;Dry needling;Moist Heat;Cryotherapy;Vestibular    PT Next Visit Plan Step negotiation, dynamic balancing exercises, rockerboard, continue hip/glute strengthening    Consulted and Agree with Plan of Care Patient           Patient will benefit from skilled therapeutic intervention in order to improve the following deficits and impairments:  Abnormal gait,Decreased balance,Decreased strength,Difficulty walking,Pain  Visit Diagnosis: Unsteadiness on feet  Muscle weakness  Other abnormalities of gait and mobility     Problem List Patient Active Problem List   Diagnosis Date Noted  . Cerebral thrombosis with cerebral infarction 02/11/2020  . Acute focal neurologic deficit with partial resolution 02/10/2020  . Depression 02/10/2020  . Osteoporosis 02/10/2020    Yetta Numbers, SPT 05/10/2020, 12:24 PM  [facility] [address]  [place], [place], [address] Phone: [phone]   Fax:  [phone]  Name: Melanie Chen MRN: [id number] Date of Birth: 1950-04-25

## 2020-05-11 ENCOUNTER — Other Ambulatory Visit: Payer: Self-pay | Admitting: Diagnostic Neuroimaging

## 2020-05-17 ENCOUNTER — Ambulatory Visit: Payer: Medicare Other

## 2020-05-18 ENCOUNTER — Ambulatory Visit: Payer: Medicare Other

## 2020-05-24 ENCOUNTER — Ambulatory Visit: Payer: Medicare Other

## 2020-05-24 ENCOUNTER — Other Ambulatory Visit: Payer: Self-pay

## 2020-05-24 DIAGNOSIS — R2689 Other abnormalities of gait and mobility: Secondary | ICD-10-CM | POA: Diagnosis not present

## 2020-05-24 DIAGNOSIS — M6281 Muscle weakness (generalized): Secondary | ICD-10-CM | POA: Diagnosis not present

## 2020-05-24 DIAGNOSIS — R2681 Unsteadiness on feet: Secondary | ICD-10-CM

## 2020-05-24 NOTE — Therapy (Signed)
[facility] [address] [place], [place], [address] Phone: [phone]   Fax:  [phone]  Physical Therapy Treatment/Recert  Patient Details  Name: Melanie Chen MRN: [id number] Date of Birth: 04-05-1950 Referring Provider (PT): Penni Bombard, MD   Encounter Date: 05/24/2020   PT End of Session - 05/24/20 1156    Visit Number 7    Number of Visits 23    Date for PT Re-Evaluation 07/23/20    Authorization Type Federal BCBS and Medicare; VL PT/OT/ST: 75; 10th visit PN    Progress Note Due on Visit 10    PT Start Time 1103    PT Stop Time 1145    PT Time Calculation (min) 42 min    Equipment Utilized During Treatment Gait belt    Activity Tolerance Patient tolerated treatment well    Behavior During Therapy WFL for tasks assessed/performed           Past Medical History:  Diagnosis Date  . Colon polyp   . Depression   . Dizziness 09/10/14  . Hemorrhoids    pt unsure  . Osteoporosis     Past Surgical History:  Procedure Laterality Date  . COLONOSCOPY  09/2000  . COLONOSCOPY WITH PROPOFOL N/A 09/22/2019   Procedure: COLONOSCOPY WITH PROPOFOL;  Surgeon: Clarene Essex, MD;  Location: [facility];  Service: Endoscopy;  Laterality: N/A;  . HOT HEMOSTASIS N/A 09/22/2019   Procedure: HOT HEMOSTASIS (ARGON PLASMA COAGULATION/BICAP);  Surgeon: Clarene Essex, MD;  Location: [facility];  Service: Endoscopy;  Laterality: N/A;  . POLYPECTOMY  09/22/2019   Procedure: POLYPECTOMY;  Surgeon: Clarene Essex, MD;  Location: [facility];  Service: Endoscopy;;  . TUBAL LIGATION  1983    There were no vitals filed for this visit.   Subjective Assessment - 05/24/20 1106    Subjective Pt reports she is frustrated beacuse she has had three falls since her stroke.  She had a fall last week that hurt her wrist enough to cancel last weeks therapy.  She fell gertting out of the car and her leg felt weak.  Her wrist feels fine now  but her back from the previous fall is still painful.    Pertinent History colon polyp, depression, dizziness, osteoporosis    Limitations Walking    Patient Stated Goals Higher energy level; return to driving, RLE to be stronger                             [facility] Adult PT Treatment/Exercise - 05/24/20 1338      Ambulation/Gait   Ambulation/Gait Yes    Ambulation/Gait Assistance 4: Min guard    Ambulation/Gait Assistance Details Gait without AD - normally walks with [facility]    Ambulation Distance (Feet) 115 Feet    Assistive device None    Gait Pattern Step-through pattern;Decreased step length - left;Decreased stance time - right;Lateral hip instability    Ambulation Surface Level;Indoor               Balance Exercises - 05/24/20 0001      Balance Exercises: Standing   Rockerboard Anterior/posterior;Lateral;EO;EC;30 seconds;Intermittent UE support;Limitations   2x30 weightshifting both, x30 EO both, 2x30 EC ant/post, 2x10 EC lateral   Rockerboard Limitations Pt would lose balance at end range of rockerboard - intermittent UE support and therpist assist to reorient to midline.  Pt unable to perform lateral EC due to significant lean to R; pt  unaware of lean and unable to correct.  Therapist assist need to prevent fall and encourage weightshift to L.    Step Ups Forward;Lateral;4 inch;Limitations   x10 each   Step Ups Limitations Verbal cueing to engage glute and stand tall on top of the box    Marching Solid surface;Dynamic;Limitations   2x21ft without AD   Marching Limitations Verbal cueing to move slow and increase stance time; during fatigue pt would try to rush and often lose balance.  Min assist needed to prevent falls.             PT Education - 05/24/20 1326    Education Details Updating POC to include 2x week due to increased falls, discussed current HEP    Person(s) Educated Patient    Methods Explanation    Comprehension Verbalized understanding             PT Short Term Goals - 05/03/20 1120      PT SHORT TERM GOAL #1   Title Pt will demonstrate independence with initial HEP and will report return to home yoga practice 2x/week    Baseline Pt reports performing exercises 4 out of the 5 days instructed. She denies any questions. Has not returned to doing her yoga yet.    Time 4    Period Weeks    Status Partially Met    Target Date 05/05/20      PT SHORT TERM GOAL #2   Title Pt will improve 30 second sit to stand to 11 reps    Baseline 9 reps; 11 reps 05/03/20    Time 4    Period Weeks    Status Achieved    Target Date 05/05/20      PT SHORT TERM GOAL #3   Title Pt will improve DGI by 4 points to indicate decreased falls risk    Baseline 13/24; 18/24 05/03/20    Time 4    Period Weeks    Status Achieved    Target Date 05/05/20      PT SHORT TERM GOAL #4   Title Pt will increase gait velocity to >/= 2.9 ft/sec (or 0.9 m/sec)    Baseline .76 m/sec (2.5 ft/sec); 2.3 ft/sec 05/03/20    Time 4    Period Weeks    Status Not Met    Target Date 05/05/20      PT SHORT TERM GOAL #5   Title Pt will negotiate 12 stairs with one rail, alternating sequence with supervision    Baseline pt has basement garage-climbs 12 to main level + 12 to upstairs bedroom    Time 4    Period Weeks    Status Achieved    Target Date 05/05/20          Updated STGs:  PT Short Term Goals - 05/24/20 2007      PT SHORT TERM GOAL #1   Title Pt will increase gait velocity to >/= 2.9 ft/sec (or 0.9 m/sec)    Baseline .76 m/sec (2.5 ft/sec); 2.3 ft/sec 05/03/20    Time 4    Period Weeks    Status On-going    Target Date 06/23/20      PT SHORT TERM GOAL #2   Title Pt will ambulate >850' with SPC on varied surfaces mod I for improved community mobility.    Time 4    Period Weeks    Status New    Target Date 06/23/20  PT Long Term Goals - 05/24/20 1335      PT LONG TERM GOAL #1   Title Pt will be independent with final HEP and  will report full return to home yoga (All LTGs 07/23/20)    Time 8    Period Weeks    Status New    Target Date 07/23/20      PT LONG TERM GOAL #2   Title Pt will report improvement in LE FOTO to >/= 74%    Baseline 62%    Time 8    Period Weeks    Status New      PT LONG TERM GOAL #3   Title Pt will improve 30 second sit to stand to >/= 14 reps to indicate improved functional LE strength    Time 8    Period Weeks    Status New      PT LONG TERM GOAL #4   Title Pt will improve DGI to >/= 20/24 to indicate decreased falls risk    Time 8    Period Weeks    Status New      PT LONG TERM GOAL #5   Title Pt will improve gait velocity without cane to >/= 3.2 ft/sec (or 1 m/sec)    Time 8    Period Weeks    Status New      PT LONG TERM GOAL #6   Title Pt will demonstrate ability to negotiate 2 flights of stairs (24) with 1 or no rail, alternating sequence, MOD I    Time 8    Period Weeks    Status New      PT LONG TERM GOAL #7   Title Pt will initiate return to driving with supervised driving short distances near her home.    Time 8    Period Weeks    Status New                 Plan - 05/24/20 1326    Clinical Impression Statement Updated POC to 2x week instead of 1x week due to increased number of falls.  Progressed balance exerices to include the rockerboard for neuromuscular reeducation and improve overall balance.  Significant deficits noted during lateral balance; pt unable to maintain neutral alignment, shifting to R and needed therapist assist to prevent fall and retrun to midline.  Pt able to ambulate and perform gait activites without AD and contact guard.    Personal Factors and Comorbidities Comorbidity 3+    Comorbidities depression, dizziness/vertigo, osteoporosis    Examination-Activity Limitations Carry;Locomotion Level;Stairs    Examination-Participation Restrictions Community Activity;Driving;Volunteer    Stability/Clinical Decision Making  Stable/Uncomplicated    Rehab Potential Good    PT Frequency 2x / week    PT Duration 8 weeks    PT Treatment/Interventions ADLs/Self Care Home Management;Aquatic Therapy;Canalith Repostioning;Electrical Stimulation;DME Instruction;Gait training;Stair training;Functional mobility training;Therapeutic activities;Therapeutic exercise;Balance training;Neuromuscular re-education;Patient/family education;Orthotic Fit/Training;Manual techniques;Passive range of motion;Dry needling;Moist Heat;Cryotherapy;Vestibular    PT Next Visit Plan Gait activities without AD.  Continue step negotiation, dynamic balancing exercises, rockerboard, R hip/glute strengthening    Consulted and Agree with Plan of Care Patient           Patient will benefit from skilled therapeutic intervention in order to improve the following deficits and impairments:  Abnormal gait,Decreased balance,Decreased strength,Difficulty walking,Pain  Visit Diagnosis: Unsteadiness on feet  Other abnormalities of gait and mobility  Muscle weakness (generalized)     Problem List Patient Active Problem List  Diagnosis Date Noted  . Cerebral thrombosis with cerebral infarction 02/11/2020  . Acute focal neurologic deficit with partial resolution 02/10/2020  . Depression 02/10/2020  . Osteoporosis 02/10/2020    Yetta Numbers, SPT 05/24/2020, 8:04 PM  [facility] [address] [place], [place], [address] Phone: [phone]   Fax:  [phone]  Name: Melanie Chen MRN: [id number] Date of Birth: 28-Feb-1950

## 2020-05-26 ENCOUNTER — Other Ambulatory Visit: Payer: Self-pay | Admitting: Diagnostic Neuroimaging

## 2020-05-29 ENCOUNTER — Ambulatory Visit: Payer: Medicare Other | Attending: Diagnostic Neuroimaging

## 2020-05-29 ENCOUNTER — Other Ambulatory Visit: Payer: Self-pay

## 2020-05-29 DIAGNOSIS — R2681 Unsteadiness on feet: Secondary | ICD-10-CM | POA: Insufficient documentation

## 2020-05-29 DIAGNOSIS — R2689 Other abnormalities of gait and mobility: Secondary | ICD-10-CM | POA: Diagnosis not present

## 2020-05-29 DIAGNOSIS — M25551 Pain in right hip: Secondary | ICD-10-CM | POA: Insufficient documentation

## 2020-05-29 DIAGNOSIS — M6281 Muscle weakness (generalized): Secondary | ICD-10-CM | POA: Diagnosis not present

## 2020-05-29 NOTE — Therapy (Signed)
[facility] [address] [place], [place], [address] Phone: [phone]   Fax:  [phone]  Physical Therapy Treatment  Patient Details  Name: Melanie Chen MRN: [id number] Date of Birth: 1950-07-29 Referring Provider (PT): Penni Bombard, MD   Encounter Date: 05/29/2020   PT End of Session - 05/29/20 1425    Visit Number 8    Number of Visits 23    Date for PT Re-Evaluation 07/23/20    Authorization Type Federal BCBS and Medicare; VL PT/OT/ST: 75; 10th visit PN    Progress Note Due on Visit 10    PT Start Time 1015    PT Stop Time 1100    PT Time Calculation (min) 45 min    Equipment Utilized During Treatment Gait belt    Activity Tolerance Patient tolerated treatment well    Behavior During Therapy WFL for tasks assessed/performed           Past Medical History:  Diagnosis Date  . Colon polyp   . Depression   . Dizziness 09/10/14  . Hemorrhoids    pt unsure  . Osteoporosis     Past Surgical History:  Procedure Laterality Date  . COLONOSCOPY  09/2000  . COLONOSCOPY WITH PROPOFOL N/A 09/22/2019   Procedure: COLONOSCOPY WITH PROPOFOL;  Surgeon: Clarene Essex, MD;  Location: [facility];  Service: Endoscopy;  Laterality: N/A;  . HOT HEMOSTASIS N/A 09/22/2019   Procedure: HOT HEMOSTASIS (ARGON PLASMA COAGULATION/BICAP);  Surgeon: Clarene Essex, MD;  Location: [facility];  Service: Endoscopy;  Laterality: N/A;  . POLYPECTOMY  09/22/2019   Procedure: POLYPECTOMY;  Surgeon: Clarene Essex, MD;  Location: [facility];  Service: Endoscopy;;  . TUBAL LIGATION  1983    There were no vitals filed for this visit.   Subjective Assessment - 05/29/20 1020    Subjective reporting only mild to no R hip pain, still noting some balance issues to R side weakness, frustrated at first but relieved she is continuing PT to improve her balance and reduce fall risk    Pertinent History colon polyp, depression, dizziness,  osteoporosis    Limitations Walking    Patient Stated Goals Higher energy level; return to driving, RLE to be stronger    Currently in Pain? Yes    Pain Score 1     Pain Location Hip    Pain Orientation Right    Pain Descriptors / Indicators Aching    Pain Type Acute pain    Pain Onset More than a month ago    Aggravating Factors  Abduction tasks                             [facility] Adult PT Treatment/Exercise - 05/29/20 0001      Knee/Hip Exercises: Aerobic   Nustep L1 8' arms 12      Knee/Hip Exercises: Supine   Bridges Strengthening;Both;1 set;10 reps    Bridges Limitations performed bridge with knee ext., 10x ea. side    Single Leg Bridge Strengthening;Both;1 set;10 reps    Other Supine Knee/Hip Exercises hooklie knee fallouts against red band, 10x               Balance Exercises - 05/29/20 0001      Balance Exercises: Standing   Step Ups Forward;Intermittent UE support;Limitations    Step Ups Limitations used low profile rockerboard as step, 15x per side    Other Standing Exercises step downs  from low profile rocker board, 15x per side             PT Education - 05/29/20 1425    Education Details Access Code: [email or web address]  URL: [email or web address]  Date: 05/29/2020  Prepared by: Sharlynn Oliphant    Exercises  Clamshell - 2 x daily - 5 x weekly - 2 sets - 10 reps  Supine March - 2 x daily - 5 x weekly - 2 sets - 10 reps  Walking March - 2 x daily - 5 x weekly - 1 sets - 3-4 reps  Backward Walking with Counter Support - 2 x daily - 5 x weekly - 1 sets - 3-4 reps  Side Stepping with Counter Support - 2 x daily - 5 x weekly - 1 sets - 3-4 reps  Standing head turns - 2 x daily - 7 x weekly - 2 sets - 10 reps  Alternating Single Leg Bridge - 2 x daily - 5 x weekly - 2 sets - 10 reps  Hooklying Single Leg Bent Knee Fallouts with Resistance - 2 x daily - 5 x weekly - 2 sets - 10 reps    Person(s) Educated Patient    Methods  Explanation;Demonstration;Tactile cues;Verbal cues;Handout    Comprehension Verbalized understanding;Returned demonstration            PT Short Term Goals - 05/24/20 2007      PT SHORT TERM GOAL #1   Title Pt will increase gait velocity to >/= 2.9 ft/sec (or 0.9 m/sec)    Baseline .76 m/sec (2.5 ft/sec); 2.3 ft/sec 05/03/20    Time 4    Period Weeks    Status On-going    Target Date 06/23/20      PT SHORT TERM GOAL #2   Title Pt will ambulate >850' with SPC on varied surfaces mod I for improved community mobility.    Time 4    Period Weeks    Status New    Target Date 06/23/20             PT Long Term Goals - 05/24/20 1335      PT LONG TERM GOAL #1   Title Pt will be independent with final HEP and will report full return to home yoga (All LTGs 07/23/20)    Time 8    Period Weeks    Status New    Target Date 07/23/20      PT LONG TERM GOAL #2   Title Pt will report improvement in LE FOTO to >/= 74%    Baseline 62%    Time 8    Period Weeks    Status New      PT LONG TERM GOAL #3   Title Pt will improve 30 second sit to stand to >/= 14 reps to indicate improved functional LE strength    Time 8    Period Weeks    Status New      PT LONG TERM GOAL #4   Title Pt will improve DGI to >/= 20/24 to indicate decreased falls risk    Time 8    Period Weeks    Status New      PT LONG TERM GOAL #5   Title Pt will improve gait velocity without cane to >/= 3.2 ft/sec (or 1 m/sec)    Time 8    Period Weeks    Status New      PT LONG TERM GOAL #6   Title Pt will  demonstrate ability to negotiate 2 flights of stairs (24) with 1 or no rail, alternating sequence, MOD I    Time 8    Period Weeks    Status New      PT LONG TERM GOAL #7   Title Pt will initiate return to driving with supervised driving short distances near her home.    Time 8    Period Weeks    Status New                 Plan - 05/29/20 1426    Clinical Impression Statement Continued to work  on gait and balance tasks as part of skilled interventions, added to HEP as noted including SLR bridging and hip fallouts against resistance, remains easily distracted  and somewhat impulsive today as evidenced by several minor missteps on R    Personal Factors and Comorbidities Comorbidity 3+    Comorbidities depression, dizziness/vertigo, osteoporosis    Examination-Activity Limitations Carry;Locomotion Level;Stairs    Examination-Participation Restrictions Community Activity;Driving;Volunteer    Stability/Clinical Decision Making Stable/Uncomplicated    Rehab Potential Good    PT Frequency 2x / week    PT Duration 8 weeks    PT Treatment/Interventions ADLs/Self Care Home Management;Aquatic Therapy;Canalith Repostioning;Electrical Stimulation;DME Instruction;Gait training;Stair training;Functional mobility training;Therapeutic activities;Therapeutic exercise;Balance training;Neuromuscular re-education;Patient/family education;Orthotic Fit/Training;Manual techniques;Passive range of motion;Dry needling;Moist Heat;Cryotherapy;Vestibular    PT Next Visit Plan Continue stepping strategies, dynamic balancing exercises, rockerboard, R hip/glute strengthening and activities emphasizing foot placement    PT Home Exercise Plan Access Code: [email or web address]    Consulted and Agree with Plan of Care Patient           Patient will benefit from skilled therapeutic intervention in order to improve the following deficits and impairments:  Abnormal gait,Decreased balance,Decreased strength,Difficulty walking,Pain  Visit Diagnosis: Unsteadiness on feet  Muscle weakness (generalized)     Problem List Patient Active Problem List   Diagnosis Date Noted  . Cerebral thrombosis with cerebral infarction 02/11/2020  . Acute focal neurologic deficit with partial resolution 02/10/2020  . Depression 02/10/2020  . Osteoporosis 02/10/2020    Lanice Shirts PT 05/29/2020, 2:47 PM  [facility] [address] [place], [place], [address] Phone: [phone]   Fax:  [phone]  Name: AVERIL DIGMAN MRN: [id number] Date of Birth: [birth year]

## 2020-05-31 ENCOUNTER — Ambulatory Visit: Payer: Medicare Other

## 2020-05-31 ENCOUNTER — Other Ambulatory Visit: Payer: Self-pay

## 2020-05-31 DIAGNOSIS — M25551 Pain in right hip: Secondary | ICD-10-CM | POA: Diagnosis not present

## 2020-05-31 DIAGNOSIS — M6281 Muscle weakness (generalized): Secondary | ICD-10-CM | POA: Diagnosis not present

## 2020-05-31 DIAGNOSIS — R2681 Unsteadiness on feet: Secondary | ICD-10-CM | POA: Diagnosis not present

## 2020-05-31 DIAGNOSIS — R2689 Other abnormalities of gait and mobility: Secondary | ICD-10-CM | POA: Diagnosis not present

## 2020-05-31 NOTE — Therapy (Signed)
[facility] [address] [place], [place], [address] Phone: [phone]   Fax:  [phone]  Physical Therapy Treatment  Patient Details  Name: Melanie Chen MRN: [id number] Date of Birth: Oct 17, 1950 Referring Provider (PT): Penni Bombard, MD   Encounter Date: 05/31/2020   PT End of Session - 05/31/20 1309    Visit Number 9    Number of Visits 23    Date for PT Re-Evaluation 07/23/20    Authorization Type Federal BCBS and Medicare; VL PT/OT/ST: 75; 10th visit PN    Progress Note Due on Visit 10    PT Start Time 1100    PT Stop Time 1145    PT Time Calculation (min) 45 min    Equipment Utilized During Treatment Gait belt    Activity Tolerance Patient tolerated treatment well    Behavior During Therapy WFL for tasks assessed/performed           Past Medical History:  Diagnosis Date  . Colon polyp   . Depression   . Dizziness 09/10/14  . Hemorrhoids    pt unsure  . Osteoporosis     Past Surgical History:  Procedure Laterality Date  . COLONOSCOPY  09/2000  . COLONOSCOPY WITH PROPOFOL N/A 09/22/2019   Procedure: COLONOSCOPY WITH PROPOFOL;  Surgeon: Clarene Essex, MD;  Location: [facility];  Service: Endoscopy;  Laterality: N/A;  . HOT HEMOSTASIS N/A 09/22/2019   Procedure: HOT HEMOSTASIS (ARGON PLASMA COAGULATION/BICAP);  Surgeon: Clarene Essex, MD;  Location: [facility];  Service: Endoscopy;  Laterality: N/A;  . POLYPECTOMY  09/22/2019   Procedure: POLYPECTOMY;  Surgeon: Clarene Essex, MD;  Location: [facility];  Service: Endoscopy;;  . TUBAL LIGATION  1983    There were no vitals filed for this visit.   Subjective Assessment - 05/31/20 1118    Subjective reports no increased pain in R hip, no falls or med changes, R hip pain continues to be an underlying issue, has not had R hip pain addressed by MDs to date    Patient is accompained by: Family member    Pertinent History colon polyp, depression,  dizziness, osteoporosis    Limitations Walking    Patient Stated Goals Higher energy level; return to driving, RLE to be stronger    Currently in Pain? Yes    Pain Score 2     Pain Location Hip    Pain Orientation Right    Pain Descriptors / Indicators Aching    Pain Onset More than a month ago    Pain Frequency Intermittent    Aggravating Factors  abduction, exiting passenger side of car                             [facility] PT Treatment/Exercise - 05/31/20 0001      Ambulation/Gait   Ambulation/Gait Yes    Ambulation/Gait Assistance 4: Min guard;5: Supervision    Ambulation Distance (Feet) 1000 Feet    Assistive device Straight cane    Gait Pattern Step-through pattern;Decreased arm swing - left    Ambulation Surface Level;Unlevel;Indoor;Outdoor;Grass    Gait Comments 1 drift to L side      Knee/Hip Exercises: Stretches   Hip Flexor Stretch Right;3 reps;20 seconds;30 seconds    Hip Flexor Stretch Limitations performed in thomas position               Balance Exercises - 05/31/20 0001  Balance Exercises: Standing   Step Ups Forward;Intermittent UE support;Limitations    Step Ups Limitations low profile rocker boars 15x per side    Other Standing Exercises step downs from low profile rocker board 15x per side             PT Education - 05/31/20 1309    Education Details R hip flexor stretch 30s x3    Person(s) Educated Patient    Methods Explanation;Demonstration;Verbal cues;Tactile cues;Handout    Comprehension Verbalized understanding;Returned demonstration            PT Short Term Goals - 05/24/20 2007      PT SHORT TERM GOAL #1   Title Pt will increase gait velocity to >/= 2.9 ft/sec (or 0.9 m/sec)    Baseline .76 m/sec (2.5 ft/sec); 2.3 ft/sec 05/03/20    Time 4    Period Weeks    Status On-going    Target Date 06/23/20      PT SHORT TERM GOAL #2   Title Pt will ambulate >850' with SPC on varied surfaces mod I for  improved community mobility.    Time 4    Period Weeks    Status New    Target Date 06/23/20             PT Long Term Goals - 05/24/20 1335      PT LONG TERM GOAL #1   Title Pt will be independent with final HEP and will report full return to home yoga (All LTGs 07/23/20)    Time 8    Period Weeks    Status New    Target Date 07/23/20      PT LONG TERM GOAL #2   Title Pt will report improvement in LE FOTO to >/= 74%    Baseline 62%    Time 8    Period Weeks    Status New      PT LONG TERM GOAL #3   Title Pt will improve 30 second sit to stand to >/= 14 reps to indicate improved functional LE strength    Time 8    Period Weeks    Status New      PT LONG TERM GOAL #4   Title Pt will improve DGI to >/= 20/24 to indicate decreased falls risk    Time 8    Period Weeks    Status New      PT LONG TERM GOAL #5   Title Pt will improve gait velocity without cane to >/= 3.2 ft/sec (or 1 m/sec)    Time 8    Period Weeks    Status New      PT LONG TERM GOAL #6   Title Pt will demonstrate ability to negotiate 2 flights of stairs (24) with 1 or no rail, alternating sequence, MOD I    Time 8    Period Weeks    Status New      PT LONG TERM GOAL #7   Title Pt will initiate return to driving with supervised driving short distances near her home.    Time 8    Period Weeks    Status New                 Plan - 05/31/20 1310    Clinical Impression Statement Todays skilled session consisted of outside ambulation, LE strength and balance training, asessment of continued R hip pain.  Patient presents with decreased ROM and flexibility in anterior R hip  with symptoms confined to groin, special testing somewhat inconclusive at this time as multiple tests elicited pain, decided to procede with R hip flexor stretch to add to HEP, focus on potential labral involvement, ITB restriciton or impingement syndrome.  Weakness evident in al  gluteals    Personal Factors and Comorbidities  Comorbidity 3+    Comorbidities depression, dizziness/vertigo, osteoporosis    Examination-Activity Limitations Carry;Locomotion Level;Stairs    Examination-Participation Restrictions Community Activity;Driving;Volunteer    Stability/Clinical Decision Making Stable/Uncomplicated    Rehab Potential Good    PT Frequency 2x / week    PT Duration 8 weeks    PT Treatment/Interventions ADLs/Self Care Home Management;Aquatic Therapy;Canalith Repostioning;Electrical Stimulation;DME Instruction;Gait training;Stair training;Functional mobility training;Therapeutic activities;Therapeutic exercise;Balance training;Neuromuscular re-education;Patient/family education;Orthotic Fit/Training;Manual techniques;Passive range of motion;Dry needling;Moist Heat;Cryotherapy;Vestibular    PT Next Visit Plan assess R hip for continued pain with focus on labral tear, ITB restrictions or impingement, add additional lateral hip strengthening tasks to HEP, 10th visit progress note    PT Home Exercise Plan Access Code: [email or web address]    Consulted and Agree with Plan of Care Patient           Patient will benefit from skilled therapeutic intervention in order to improve the following deficits and impairments:  Abnormal gait,Decreased balance,Decreased strength,Difficulty walking,Pain  Visit Diagnosis: Unsteadiness on feet  Muscle weakness (generalized)  Pain in right hip     Problem List Patient Active Problem List   Diagnosis Date Noted  . Cerebral thrombosis with cerebral infarction 02/11/2020  . Acute focal neurologic deficit with partial resolution 02/10/2020  . Depression 02/10/2020  . Osteoporosis 02/10/2020    Lanice Shirts PT 05/31/2020, 1:27 PM  [facility] [address] [place], [place], [address] Phone: [phone]   Fax:  [phone]  Name: Melanie Chen MRN: [id number] Date of Birth: 1950-07-13

## 2020-05-31 NOTE — Patient Instructions (Signed)
Added R hip flexor stretch to HEP, 30s x3

## 2020-06-05 ENCOUNTER — Other Ambulatory Visit: Payer: Self-pay

## 2020-06-05 ENCOUNTER — Ambulatory Visit: Payer: Medicare Other

## 2020-06-05 DIAGNOSIS — M25551 Pain in right hip: Secondary | ICD-10-CM

## 2020-06-05 DIAGNOSIS — R2681 Unsteadiness on feet: Secondary | ICD-10-CM

## 2020-06-05 DIAGNOSIS — M6281 Muscle weakness (generalized): Secondary | ICD-10-CM | POA: Diagnosis not present

## 2020-06-05 DIAGNOSIS — R2689 Other abnormalities of gait and mobility: Secondary | ICD-10-CM | POA: Diagnosis not present

## 2020-06-06 NOTE — Therapy (Addendum)
[facility] [address] [place], [place], [address] Phone: [phone]   Fax:  [phone]  Physical Therapy Treatment/Progress note  Patient Details  Name: Melanie Chen MRN: [id number] Date of Birth: [birth year] Referring Provider (PT): Penni Bombard, MD   Encounter Date: 06/05/2020    Past Medical History:  Diagnosis Date  . Colon polyp   . Depression   . Dizziness 09/10/14  . Hemorrhoids    pt unsure  . Osteoporosis     Past Surgical History:  Procedure Laterality Date  . COLONOSCOPY  09/2000  . COLONOSCOPY WITH PROPOFOL N/A 09/22/2019   Procedure: COLONOSCOPY WITH PROPOFOL;  Surgeon: Clarene Essex, MD;  Location: [facility];  Service: Endoscopy;  Laterality: N/A;  . HOT HEMOSTASIS N/A 09/22/2019   Procedure: HOT HEMOSTASIS (ARGON PLASMA COAGULATION/BICAP);  Surgeon: Clarene Essex, MD;  Location: [facility];  Service: Endoscopy;  Laterality: N/A;  . POLYPECTOMY  09/22/2019   Procedure: POLYPECTOMY;  Surgeon: Clarene Essex, MD;  Location: [facility];  Service: Endoscopy;;  . TUBAL LIGATION  1983    There were no vitals filed for this visit.           06/05/20 0001  Balance Exercises: Standing  Step Ups Forward;4 inch;UE support 1;Limitations;Lateral  Step Ups Limitations x10 step ups each leg at counter with SPC, x10 lateral steps each leg with UE support at counter; lack of hip extension at top       06/05/20 0001  Transfers  Transfers Sit to Stand  Sit to Stand 5: Supervision;Without upper extremity assist  Sit to Stand Details Tactile cues for sequencing;Verbal cues for sequencing;Verbal cues for technique  Sit to Stand Details (indicate cue type and reason) Verbal and tactile cueing to shift weight over legs, and sit at the edge of mat  Five time sit to stand comments  x10 on blue airex pad; pt lacks hip extension at top and ecc control, able to perform without therapist assist after  movement pattern established                        PT Short Term Goals - 06/05/20 1137      PT SHORT TERM GOAL #1   Title Pt will increase gait velocity to >/= 2.9 ft/sec (or 0.9 m/sec)    Baseline .76 m/sec (2.5 ft/sec); 2.3 ft/sec 05/03/20; 06/05/20 Deferred due to R hip pain    Time 4    Period Weeks    Status On-going    Target Date 06/23/20      PT SHORT TERM GOAL #2   Title Pt will ambulate >850' with SPC on varied surfaces mod I for improved community mobility.    Baseline 06/05/20 Ambulation restricted to indoor in gym area using [facility],, deferred outside ambulaiton due to possibility of exacerbating hip pain    Time 4    Period Weeks    Status New    Target Date 06/23/20             PT Long Term Goals - 05/24/20 1335      PT LONG TERM GOAL #1   Title Pt will be independent with final HEP and will report full return to home yoga (All LTGs 07/23/20)    Time 8    Period Weeks    Status New    Target Date 07/23/20      PT LONG TERM GOAL #2   Title Pt will report  improvement in LE FOTO to >/= 74%    Baseline 62%    Time 8    Period Weeks    Status New      PT LONG TERM GOAL #3   Title Pt will improve 30 second sit to stand to >/= 14 reps to indicate improved functional LE strength    Time 8    Period Weeks    Status New      PT LONG TERM GOAL #4   Title Pt will improve DGI to >/= 20/24 to indicate decreased falls risk    Time 8    Period Weeks    Status New      PT LONG TERM GOAL #5   Title Pt will improve gait velocity without cane to >/= 3.2 ft/sec (or 1 m/sec)    Time 8    Period Weeks    Status New      PT LONG TERM GOAL #6   Title Pt will demonstrate ability to negotiate 2 flights of stairs (24) with 1 or no rail, alternating sequence, MOD I    Time 8    Period Weeks    Status New      PT LONG TERM GOAL #7   Title Pt will initiate return to driving with supervised driving short distances near her home.    Time 8    Period Weeks     Status New                  Patient will benefit from skilled therapeutic intervention in order to improve the following deficits and impairments:  Abnormal gait,Decreased balance,Decreased strength,Difficulty walking,Pain  Visit Diagnosis: Unsteadiness on feet  Muscle weakness (generalized)  Pain in right hip     Problem List Patient Active Problem List   Diagnosis Date Noted  . Cerebral thrombosis with cerebral infarction 02/11/2020  . Acute focal neurologic deficit with partial resolution 02/10/2020  . Depression 02/10/2020  . Osteoporosis 02/10/2020    Lanice Shirts PT 06/08/2020, 11:41 AM  [facility] [address] [place], [place], [address] Phone: [phone]   Fax:  [phone]  Name: JOSALIN CARNEIRO MRN: [id number] Date of Birth: 1950/08/19

## 2020-06-07 ENCOUNTER — Other Ambulatory Visit: Payer: Self-pay

## 2020-06-07 ENCOUNTER — Ambulatory Visit: Payer: Medicare Other

## 2020-06-07 DIAGNOSIS — M25551 Pain in right hip: Secondary | ICD-10-CM | POA: Diagnosis not present

## 2020-06-07 DIAGNOSIS — M6281 Muscle weakness (generalized): Secondary | ICD-10-CM

## 2020-06-07 DIAGNOSIS — R2681 Unsteadiness on feet: Secondary | ICD-10-CM | POA: Diagnosis not present

## 2020-06-07 DIAGNOSIS — R2689 Other abnormalities of gait and mobility: Secondary | ICD-10-CM

## 2020-06-07 NOTE — Therapy (Signed)
[facility] [address] [place], [place], [address] Phone: [phone]   Fax:  [phone]  Physical Therapy Treatment  Patient Details  Name: Melanie Chen MRN: [id number] Date of Birth: 02-08-1951 Referring Provider (PT): Penni Bombard, MD   Encounter Date: 06/07/2020   PT End of Session - 06/07/20 0935    Visit Number 11    Number of Visits 23    Date for PT Re-Evaluation 07/23/20    Authorization Type Federal BCBS and Medicare; VL PT/OT/ST: 75; 10th visit PN    Progress Note Due on Visit 10    PT Start Time 0934    PT Stop Time 1015    PT Time Calculation (min) 41 min    Equipment Utilized During Treatment Gait belt    Activity Tolerance Patient tolerated treatment well    Behavior During Therapy WFL for tasks assessed/performed           Past Medical History:  Diagnosis Date  . Colon polyp   . Depression   . Dizziness 09/10/14  . Hemorrhoids    pt unsure  . Osteoporosis     Past Surgical History:  Procedure Laterality Date  . COLONOSCOPY  09/2000  . COLONOSCOPY WITH PROPOFOL N/A 09/22/2019   Procedure: COLONOSCOPY WITH PROPOFOL;  Surgeon: Clarene Essex, MD;  Location: [facility];  Service: Endoscopy;  Laterality: N/A;  . HOT HEMOSTASIS N/A 09/22/2019   Procedure: HOT HEMOSTASIS (ARGON PLASMA COAGULATION/BICAP);  Surgeon: Clarene Essex, MD;  Location: [facility];  Service: Endoscopy;  Laterality: N/A;  . POLYPECTOMY  09/22/2019   Procedure: POLYPECTOMY;  Surgeon: Clarene Essex, MD;  Location: [facility];  Service: Endoscopy;;  . TUBAL LIGATION  1983    There were no vitals filed for this visit.   Subjective Assessment - 06/07/20 0936    Subjective Pt reports her hip is feeling well today - it usually feels much better in the morning compared to the afternoon.  She says she was very tired after last session.    Patient is accompained by: Family member    Pertinent History colon polyp,  depression, dizziness, osteoporosis    Limitations Walking    Patient Stated Goals Higher energy level; return to driving, RLE to be stronger    Pain Onset More than a month ago                             [facility] Adult PT Treatment/Exercise - 06/07/20 0001      Transfers   Transfers Sit to Stand    Sit to Stand 5: Supervision    Sit to Stand Details Verbal cues for technique;Verbal cues for sequencing    Sit to Stand Details (indicate cue type and reason) Verbal cueing for foot placement    Five time sit to stand comments  x10 on solid surface, x10 airex pad - verbal cueing need during squats on airex and instability noted in top position.  Improved ecc control compared to last session      Ambulation/Gait   Ambulation/Gait Yes    Ambulation/Gait Assistance 5: Supervision    Ambulation/Gait Assistance Details Lateral hip drop noted R>L, excessive hip rotation noted during walking - verbal cueing to engage glute and posterior hip muscles similar to stepping exercises to prevent hip drop    Ambulation Distance (Feet) 230 Feet    Assistive device Straight cane    Gait Pattern Step-through pattern;Lateral hip  instability    Ambulation Surface Level;Indoor               Balance Exercises - 06/07/20 0946      Balance Exercises: Standing   Standing Eyes Opened Narrow base of support (BOS);Head turns;Foam/compliant surface;Limitations   on airex pad   Standing Eyes Opened Limitations x30 static stance, x10 horizontal head turns CGA - significant postural sway noted and pt required rest following, x10 vertical head turns CGA posterior postural sway noted    Standing Eyes Closed Wide (BOA);Head turns;Foam/compliant surface;Limitations    Standing Eyes Closed Limitations 2x30 sec static hold, x5 horizontal and vertical head turns - posterior R lean most noted - pt able to complete all exercises without rest    Step Ups Forward;Lateral;4 inch;UE support 1;Limitations     Step Ups Limitations x10 forward step up - R lateral hip drop noted, x8 forward with visual cue of mirror and tactile cueing at hips to prevent lateral hip drop - able to perform exercises without drop after cueing, x10 lateral step ups, x6 lateral step downs with 3sec ecc - visual cueing with mirro rto prevent lateral hip drop and tactile cueing at hips, unable to prevent R hip drop even with cueing             PT Education - 06/07/20 1024    Education Details Preventing lateral hip drop, engaging glutes during gait    Person(s) Educated Patient    Methods Explanation    Comprehension Verbalized understanding            PT Short Term Goals - 05/24/20 2007      PT SHORT TERM GOAL #1   Title Pt will increase gait velocity to >/= 2.9 ft/sec (or 0.9 m/sec)    Baseline .76 m/sec (2.5 ft/sec); 2.3 ft/sec 05/03/20    Time 4    Period Weeks    Status On-going    Target Date 06/23/20      PT SHORT TERM GOAL #2   Title Pt will ambulate >850' with SPC on varied surfaces mod I for improved community mobility.    Time 4    Period Weeks    Status New    Target Date 06/23/20             PT Long Term Goals - 05/24/20 1335      PT LONG TERM GOAL #1   Title Pt will be independent with final HEP and will report full return to home yoga (All LTGs 07/23/20)    Time 8    Period Weeks    Status New    Target Date 07/23/20      PT LONG TERM GOAL #2   Title Pt will report improvement in LE FOTO to >/= 74%    Baseline 62%    Time 8    Period Weeks    Status New      PT LONG TERM GOAL #3   Title Pt will improve 30 second sit to stand to >/= 14 reps to indicate improved functional LE strength    Time 8    Period Weeks    Status New      PT LONG TERM GOAL #4   Title Pt will improve DGI to >/= 20/24 to indicate decreased falls risk    Time 8    Period Weeks    Status New      PT LONG TERM GOAL #5   Title Pt will improve  gait velocity without cane to >/= 3.2 ft/sec (or 1 m/sec)     Time 8    Period Weeks    Status New      PT LONG TERM GOAL #6   Title Pt will demonstrate ability to negotiate 2 flights of stairs (24) with 1 or no rail, alternating sequence, MOD I    Time 8    Period Weeks    Status New      PT LONG TERM GOAL #7   Title Pt will initiate return to driving with supervised driving short distances near her home.    Time 8    Period Weeks    Status New                 Plan - 06/07/20 1024    Clinical Impression Statement Progressed glute strengthening and balance exercises due to lack of R hip pain today.  Posterior and R postural lean noted during all balance exercises.  Pt displayed most sway during more dynamic balance activities and required CGA to prevent LOB.  Significant R lateral hip drop noted - pt able to self correct with visual and tactile cueing.  Pt tolerated all exercises well without any increase in hip pain.    Personal Factors and Comorbidities Comorbidity 3+    Comorbidities depression, dizziness/vertigo, osteoporosis    Examination-Activity Limitations Carry;Locomotion Level;Stairs    Examination-Participation Restrictions Community Activity;Driving;Volunteer    Stability/Clinical Decision Making Stable/Uncomplicated    Rehab Potential Good    PT Frequency 2x / week    PT Duration 8 weeks    PT Treatment/Interventions ADLs/Self Care Home Management;Aquatic Therapy;Canalith Repostioning;Electrical Stimulation;DME Instruction;Gait training;Stair training;Functional mobility training;Therapeutic activities;Therapeutic exercise;Balance training;Neuromuscular re-education;Patient/family education;Orthotic Fit/Training;Manual techniques;Passive range of motion;Dry needling;Moist Heat;Cryotherapy;Vestibular    PT Next Visit Plan May need to add more sessions - only scheduled through 4/27, assess R hip pain, functional lateral hip strengthening, gait outside    PT Home Exercise Plan Access Code: [email or web address]    Consulted and Agree with  Plan of Care Patient           Patient will benefit from skilled therapeutic intervention in order to improve the following deficits and impairments:  Abnormal gait,Decreased balance,Decreased strength,Difficulty walking,Pain  Visit Diagnosis: Unsteadiness on feet  Muscle weakness (generalized)  Other abnormalities of gait and mobility     Problem List Patient Active Problem List   Diagnosis Date Noted  . Cerebral thrombosis with cerebral infarction 02/11/2020  . Acute focal neurologic deficit with partial resolution 02/10/2020  . Depression 02/10/2020  . Osteoporosis 02/10/2020    Yetta Numbers, SPT 06/07/2020, 12:57 PM  [facility] [address] [place], [place], [address] Phone: [phone]   Fax:  [phone]  Name: Melanie Chen MRN: [id number] Date of Birth: December 16, 1950

## 2020-06-12 ENCOUNTER — Ambulatory Visit: Payer: Medicare Other

## 2020-06-12 ENCOUNTER — Other Ambulatory Visit: Payer: Self-pay

## 2020-06-12 DIAGNOSIS — M25551 Pain in right hip: Secondary | ICD-10-CM | POA: Diagnosis not present

## 2020-06-12 DIAGNOSIS — R2681 Unsteadiness on feet: Secondary | ICD-10-CM

## 2020-06-12 DIAGNOSIS — M6281 Muscle weakness (generalized): Secondary | ICD-10-CM

## 2020-06-12 DIAGNOSIS — R2689 Other abnormalities of gait and mobility: Secondary | ICD-10-CM | POA: Diagnosis not present

## 2020-06-12 NOTE — Therapy (Signed)
[facility] [address] [place], [place], [address] Phone: [phone]   Fax:  [phone]  Physical Therapy Treatment  Patient Details  Name: Melanie Chen MRN: [id number] Date of Birth: 04-Aug-1950 Referring Provider (PT): Penni Bombard, MD   Encounter Date: 06/12/2020   PT End of Session - 06/12/20 1701    Visit Number 12    Number of Visits 23    Date for PT Re-Evaluation 07/23/20    Authorization Type Federal BCBS and Medicare; VL PT/OT/ST: 75; 10th visit PN    Progress Note Due on Visit 20    PT Start Time 1315    PT Stop Time 1400    PT Time Calculation (min) 45 min    Activity Tolerance Patient tolerated treatment well           Past Medical History:  Diagnosis Date  . Colon polyp   . Depression   . Dizziness 09/10/14  . Hemorrhoids    pt unsure  . Osteoporosis     Past Surgical History:  Procedure Laterality Date  . COLONOSCOPY  09/2000  . COLONOSCOPY WITH PROPOFOL N/A 09/22/2019   Procedure: COLONOSCOPY WITH PROPOFOL;  Surgeon: Clarene Essex, MD;  Location: [facility];  Service: Endoscopy;  Laterality: N/A;  . HOT HEMOSTASIS N/A 09/22/2019   Procedure: HOT HEMOSTASIS (ARGON PLASMA COAGULATION/BICAP);  Surgeon: Clarene Essex, MD;  Location: [facility];  Service: Endoscopy;  Laterality: N/A;  . POLYPECTOMY  09/22/2019   Procedure: POLYPECTOMY;  Surgeon: Clarene Essex, MD;  Location: [facility];  Service: Endoscopy;;  . TUBAL LIGATION  1983    There were no vitals filed for this visit.   Subjective Assessment - 06/12/20 1318    Subjective Drove herself today and feels good about it, R hip pain persists but improved    Patient is accompained by: Family member    Pertinent History colon polyp, depression, dizziness, osteoporosis    Limitations Walking    Patient Stated Goals Higher energy level; return to driving, RLE to be stronger    Pain Onset More than a month ago                              [facility] Adult PT Treatment/Exercise - 06/12/20 0001      Transfers   Transfers Sit to Stand    Sit to Stand 7: Independent      Ambulation/Gait   Ambulation/Gait Yes    Ambulation/Gait Assistance 5: Supervision    Ambulation/Gait Assistance Details minimal deficits    Ambulation Distance (Feet) 230 Feet    Assistive device Straight cane    Gait Pattern Step-through pattern    Ambulation Surface Level;Indoor               Balance Exercises - 06/12/20 0001      Balance Exercises: Standing   Rockerboard Anterior/posterior;Lateral;30 seconds;Limitations    Rockerboard Limitations 30s static hold ea. direction no UE support, 15x WS in ea. direction, step up to board 15x/LE, static standing with alt. UE swings 15x    Step Ups Forward;Limitations    Step Ups Limitations performed 15x per LE no UE support onto rockerboard    Tandem Gait Forward;Foam/compliant surface;5 reps;Limitations    Tandem Gait Limitations 5 trips in // bars no UE support    Retro Gait Foam/compliant surface;5 reps;Limitations    Retro Gait Limitations performed in //bars 5 trips no UE support  Other Standing Exercises atempted braining in // bars but R hip ain limited performance               PT Short Term Goals - 06/05/20 1137      PT SHORT TERM GOAL #1   Title Pt will increase gait velocity to >/= 2.9 ft/sec (or 0.9 m/sec)    Baseline .76 m/sec (2.5 ft/sec); 2.3 ft/sec 05/03/20; 06/05/20 Deferred due to R hip pain    Time 4    Period Weeks    Status On-going    Target Date 06/23/20      PT SHORT TERM GOAL #2   Title Pt will ambulate >850' with SPC on varied surfaces mod I for improved community mobility.    Baseline 06/05/20 Ambulation restricted to indoor in gym area using [facility],, deferred outside ambulaiton due to possibility of exacerbating hip pain    Time 4    Period Weeks    Status New    Target Date 06/23/20             PT Long Term Goals -  05/24/20 1335      PT LONG TERM GOAL #1   Title Pt will be independent with final HEP and will report full return to home yoga (All LTGs 07/23/20)    Time 8    Period Weeks    Status New    Target Date 07/23/20      PT LONG TERM GOAL #2   Title Pt will report improvement in LE FOTO to >/= 74%    Baseline 62%    Time 8    Period Weeks    Status New      PT LONG TERM GOAL #3   Title Pt will improve 30 second sit to stand to >/= 14 reps to indicate improved functional LE strength    Time 8    Period Weeks    Status New      PT LONG TERM GOAL #4   Title Pt will improve DGI to >/= 20/24 to indicate decreased falls risk    Time 8    Period Weeks    Status New      PT LONG TERM GOAL #5   Title Pt will improve gait velocity without cane to >/= 3.2 ft/sec (or 1 m/sec)    Time 8    Period Weeks    Status New      PT LONG TERM GOAL #6   Title Pt will demonstrate ability to negotiate 2 flights of stairs (24) with 1 or no rail, alternating sequence, MOD I    Time 8    Period Weeks    Status New      PT LONG TERM GOAL #7   Title Pt will initiate return to driving with supervised driving short distances near her home.    Time 8    Period Weeks    Status New                 Plan - 06/12/20 1702    Clinical Impression Statement Todays skilled session consisted of balance tasks and functional gait tasks in // bars    Personal Factors and Comorbidities Comorbidity 3+    Comorbidities depression, dizziness/vertigo, osteoporosis    Examination-Activity Limitations Carry;Locomotion Level;Stairs    Examination-Participation Restrictions Community Activity;Driving;Volunteer    Stability/Clinical Decision Making Stable/Uncomplicated    Rehab Potential Good    PT Frequency 2x / week    PT  Duration 8 weeks    PT Treatment/Interventions ADLs/Self Care Home Management;Aquatic Therapy;Canalith Repostioning;Electrical Stimulation;DME Instruction;Gait training;Stair  training;Functional mobility training;Therapeutic activities;Therapeutic exercise;Balance training;Neuromuscular re-education;Patient/family education;Orthotic Fit/Training;Manual techniques;Passive range of motion;Dry needling;Moist Heat;Cryotherapy;Vestibular    PT Next Visit Plan May need to add more sessions - only scheduled through 4/27, assess R hip pain, functional lateral hip strengthening, gait outside    PT Home Exercise Plan Access Code: [email or web address]    Consulted and Agree with Plan of Care Patient           Patient will benefit from skilled therapeutic intervention in order to improve the following deficits and impairments:  Abnormal gait,Decreased balance,Decreased strength,Difficulty walking,Pain  Visit Diagnosis: Unsteadiness on feet  Muscle weakness (generalized)  Pain in right hip     Problem List Patient Active Problem List   Diagnosis Date Noted  . Cerebral thrombosis with cerebral infarction 02/11/2020  . Acute focal neurologic deficit with partial resolution 02/10/2020  . Depression 02/10/2020  . Osteoporosis 02/10/2020    Lanice Shirts PT 06/12/2020, 5:45 PM  [facility] [address] [place], [place], [address] Phone: [phone]   Fax:  [phone]  Name: Melanie Chen MRN: [id number] Date of Birth: [birth year]

## 2020-06-14 ENCOUNTER — Ambulatory Visit: Payer: Medicare Other

## 2020-06-14 ENCOUNTER — Other Ambulatory Visit: Payer: Self-pay

## 2020-06-14 DIAGNOSIS — M6281 Muscle weakness (generalized): Secondary | ICD-10-CM | POA: Diagnosis not present

## 2020-06-14 DIAGNOSIS — R2681 Unsteadiness on feet: Secondary | ICD-10-CM | POA: Diagnosis not present

## 2020-06-14 DIAGNOSIS — M25551 Pain in right hip: Secondary | ICD-10-CM | POA: Diagnosis not present

## 2020-06-14 DIAGNOSIS — R2689 Other abnormalities of gait and mobility: Secondary | ICD-10-CM | POA: Diagnosis not present

## 2020-06-14 NOTE — Therapy (Signed)
[facility] [address] [place], [place], [address] Phone: [phone]   Fax:  [phone]  Physical Therapy Treatment  Patient Details  Name: Melanie Chen MRN: [id number] Date of Birth: 1950/12/03 Referring Provider (PT): Penni Bombard, MD   Encounter Date: 06/14/2020   PT End of Session - 06/14/20 1111    Visit Number 13    Number of Visits 23    Date for PT Re-Evaluation 07/23/20    Authorization Type Federal BCBS and Medicare; VL PT/OT/ST: 75; 10th visit PN    Progress Note Due on Visit 20    PT Start Time 1105    PT Stop Time 1145    PT Time Calculation (min) 40 min    Equipment Utilized During Treatment Gait belt    Activity Tolerance Patient tolerated treatment well           Past Medical History:  Diagnosis Date  . Colon polyp   . Depression   . Dizziness 09/10/14  . Hemorrhoids    pt unsure  . Osteoporosis     Past Surgical History:  Procedure Laterality Date  . COLONOSCOPY  09/2000  . COLONOSCOPY WITH PROPOFOL N/A 09/22/2019   Procedure: COLONOSCOPY WITH PROPOFOL;  Surgeon: Clarene Essex, MD;  Location: [facility];  Service: Endoscopy;  Laterality: N/A;  . HOT HEMOSTASIS N/A 09/22/2019   Procedure: HOT HEMOSTASIS (ARGON PLASMA COAGULATION/BICAP);  Surgeon: Clarene Essex, MD;  Location: [facility];  Service: Endoscopy;  Laterality: N/A;  . POLYPECTOMY  09/22/2019   Procedure: POLYPECTOMY;  Surgeon: Clarene Essex, MD;  Location: [facility];  Service: Endoscopy;;  . TUBAL LIGATION  1983    There were no vitals filed for this visit.   Subjective Assessment - 06/14/20 1108    Subjective has been able to ambulate w/o need of cane at times and feels comfortable with that, R hip pain present but less and not a limiting factor today    Patient is accompained by: Family member    Pertinent History colon polyp, depression, dizziness, osteoporosis    Limitations Walking    Patient Stated Goals  Higher energy level; return to driving, RLE to be stronger    Pain Onset More than a month ago                             [facility] Adult PT Treatment/Exercise - 06/14/20 0001      Transfers   Transfers Sit to Stand    Sit to Stand 5: Supervision      Ambulation/Gait   Ambulation/Gait Yes    Ambulation/Gait Assistance 5: Supervision    Ambulation/Gait Assistance Details R trunk shift to accomodate R hip pain    Ambulation Distance (Feet) 100 Feet    Assistive device Straight cane;None    Gait Pattern Step-through pattern    Ambulation Surface Level;Indoor      Knee/Hip Exercises: Aerobic   Nustep L1 8' arms 12               Balance Exercises - 06/14/20 0001      Balance Exercises: Standing   Tandem Stance Eyes open;Foam/compliant surface;Intermittent upper extremity support;2 reps;30 secs;Limitations    Tandem Stance Time performed in bars 30s hold int. UE support    Rockerboard Anterior/posterior;Lateral;Intermittent UE support;Limitations    Rockerboard Limitations WS in ea. direction for 1 min. duration    Step Ups Forward;UE support 1;Limitations  Step Ups Limitations Performed 15x ea. LE with int. UEsupport               PT Short Term Goals - 06/05/20 1137      PT SHORT TERM GOAL #1   Title Pt will increase gait velocity to >/= 2.9 ft/sec (or 0.9 m/sec)    Baseline .76 m/sec (2.5 ft/sec); 2.3 ft/sec 05/03/20; 06/05/20 Deferred due to R hip pain    Time 4    Period Weeks    Status On-going    Target Date 06/23/20      PT SHORT TERM GOAL #2   Title Pt will ambulate >850' with SPC on varied surfaces mod I for improved community mobility.    Baseline 06/05/20 Ambulation restricted to indoor in gym area using [facility],, deferred outside ambulaiton due to possibility of exacerbating hip pain    Time 4    Period Weeks    Status New    Target Date 06/23/20             PT Long Term Goals - 05/24/20 1335      PT LONG TERM GOAL #1   Title Pt  will be independent with final HEP and will report full return to home yoga (All LTGs 07/23/20)    Time 8    Period Weeks    Status New    Target Date 07/23/20      PT LONG TERM GOAL #2   Title Pt will report improvement in LE FOTO to >/= 74%    Baseline 62%    Time 8    Period Weeks    Status New      PT LONG TERM GOAL #3   Title Pt will improve 30 second sit to stand to >/= 14 reps to indicate improved functional LE strength    Time 8    Period Weeks    Status New      PT LONG TERM GOAL #4   Title Pt will improve DGI to >/= 20/24 to indicate decreased falls risk    Time 8    Period Weeks    Status New      PT LONG TERM GOAL #5   Title Pt will improve gait velocity without cane to >/= 3.2 ft/sec (or 1 m/sec)    Time 8    Period Weeks    Status New      PT LONG TERM GOAL #6   Title Pt will demonstrate ability to negotiate 2 flights of stairs (24) with 1 or no rail, alternating sequence, MOD I    Time 8    Period Weeks    Status New      PT LONG TERM GOAL #7   Title Pt will initiate return to driving with supervised driving short distances near her home.    Time 8    Period Weeks    Status New                 Plan - 06/14/20 1122    Clinical Impression Statement Todays skilled treatment consisted of R hip strengthening in WB to train balance in both static and dynamic enviroments, on foam and solid surfaces.  Pelvic alignment rechecked and found to be level.  Continued s dynamic balance challenges in // bars accomodating for any R hip discomfort    Personal Factors and Comorbidities Comorbidity 3+    Comorbidities depression, dizziness/vertigo, osteoporosis    Examination-Activity Limitations Carry;Locomotion Level;Stairs  Examination-Participation Restrictions Community Activity;Driving;Volunteer    Stability/Clinical Decision Making Stable/Uncomplicated    Rehab Potential Good    PT Frequency 2x / week    PT Duration 8 weeks    PT  Treatment/Interventions ADLs/Self Care Home Management;Aquatic Therapy;Canalith Repostioning;Electrical Stimulation;DME Instruction;Gait training;Stair training;Functional mobility training;Therapeutic activities;Therapeutic exercise;Balance training;Neuromuscular re-education;Patient/family education;Orthotic Fit/Training;Manual techniques;Passive range of motion;Dry needling;Moist Heat;Cryotherapy;Vestibular    PT Next Visit Plan Mnitor R hip pain, functional lateral hip strengthening, gait outside, continue balance training to correct compensation during gait of R trunk lean to moderate pain    PT Home Exercise Plan Access Code: [email or web address]    Consulted and Agree with Plan of Care Patient           Patient will benefit from skilled therapeutic intervention in order to improve the following deficits and impairments:  Abnormal gait,Decreased balance,Decreased strength,Difficulty walking,Pain  Visit Diagnosis: Unsteadiness on feet  Muscle weakness (generalized)  Pain in right hip     Problem List Patient Active Problem List   Diagnosis Date Noted  . Cerebral thrombosis with cerebral infarction 02/11/2020  . Acute focal neurologic deficit with partial resolution 02/10/2020  . Depression 02/10/2020  . Osteoporosis 02/10/2020    Lanice Shirts PT 06/14/2020, 11:55 AM  [facility] [address] [place], [place], [address] Phone: [phone]   Fax:  [phone]  Name: EMALENE WELTE MRN: [id number] Date of Birth: 1951/02/20

## 2020-06-19 ENCOUNTER — Ambulatory Visit: Payer: Medicare Other

## 2020-06-19 ENCOUNTER — Other Ambulatory Visit: Payer: Self-pay

## 2020-06-19 DIAGNOSIS — M6281 Muscle weakness (generalized): Secondary | ICD-10-CM | POA: Diagnosis not present

## 2020-06-19 DIAGNOSIS — R2681 Unsteadiness on feet: Secondary | ICD-10-CM | POA: Diagnosis not present

## 2020-06-19 DIAGNOSIS — R2689 Other abnormalities of gait and mobility: Secondary | ICD-10-CM | POA: Diagnosis not present

## 2020-06-19 DIAGNOSIS — M25551 Pain in right hip: Secondary | ICD-10-CM | POA: Diagnosis not present

## 2020-06-19 NOTE — Therapy (Signed)
[facility] [address] [place], [place], [address] Phone: [phone]   Fax:  [phone]  Physical Therapy Treatment  Patient Details  Name: AUDRIE KURI MRN: [id number] Date of Birth: 16-Jun-1950 Referring Provider (PT): Penni Bombard, MD   Encounter Date: 06/19/2020   PT End of Session - 06/19/20 1325    Visit Number 14    Number of Visits 23    Date for PT Re-Evaluation 07/23/20    Authorization Type Federal BCBS and Medicare; VL PT/OT/ST: 75; 10th visit PN    Progress Note Due on Visit 20    PT Start Time 1323   pt arrived a little late   PT Stop Time 1401    PT Time Calculation (min) 38 min    Equipment Utilized During Treatment Gait belt    Activity Tolerance Patient tolerated treatment well           Past Medical History:  Diagnosis Date  . Colon polyp   . Depression   . Dizziness 09/10/14  . Hemorrhoids    pt unsure  . Osteoporosis     Past Surgical History:  Procedure Laterality Date  . COLONOSCOPY  09/2000  . COLONOSCOPY WITH PROPOFOL N/A 09/22/2019   Procedure: COLONOSCOPY WITH PROPOFOL;  Surgeon: Clarene Essex, MD;  Location: [facility];  Service: Endoscopy;  Laterality: N/A;  . HOT HEMOSTASIS N/A 09/22/2019   Procedure: HOT HEMOSTASIS (ARGON PLASMA COAGULATION/BICAP);  Surgeon: Clarene Essex, MD;  Location: [facility];  Service: Endoscopy;  Laterality: N/A;  . POLYPECTOMY  09/22/2019   Procedure: POLYPECTOMY;  Surgeon: Clarene Essex, MD;  Location: [facility];  Service: Endoscopy;;  . TUBAL LIGATION  1983    There were no vitals filed for this visit.   Subjective Assessment - 06/19/20 1326    Subjective Pt reports she is doing really well. She is hardly using cane but does use it some when goes out and later in the day. Hip has also been doing better.    Patient is accompained by: Family member    Pertinent History colon polyp, depression, dizziness, osteoporosis    Limitations  Walking    Patient Stated Goals Higher energy level; return to driving, RLE to be stronger    Currently in Pain? No/denies    Pain Onset More than a month ago                             [facility] Adult PT Treatment/Exercise - 06/19/20 1327      Ambulation/Gait   Ambulation/Gait Yes    Ambulation/Gait Assistance 6: Modified independent (Device/Increase time);5: Supervision    Ambulation/Gait Assistance Details Pt was given cues to try to increase right foot clearance to get more heel strike as scuffing right foot at times.    Ambulation Distance (Feet) 1000 Feet    Assistive device Straight cane    Gait Pattern Step-through pattern;Decreased hip/knee flexion - right;Decreased dorsiflexion - right    Ambulation Surface Level;Unlevel;Indoor;Outdoor;Paved;Grass    Gait velocity 11.11 sec=0.33ms      Neuro Re-ed    Neuro Re-ed Details  Reciprocal stepping over 4 different height hurdles with cane x 4 laps then side steping over the 4 hurdles x 3 laps with cane close SBA/CGA. In // bars: standing on rockerboard positioned ant/post maintaining level eyes open x 30 sec then eyes closed x 30 sec then rocking board ant/post x 10 then alternating cone taps  x 10. Turned rockerboard to lateral and performed standing eyes open x 30 sec then eyes closed x 30 sec. Pt less steady with eyes closed especially with board lateral.                    PT Short Term Goals - 06/19/20 1525      PT SHORT TERM GOAL #1   Title Pt will increase gait velocity to >/= 2.9 ft/sec (or 0.9 m/sec)    Baseline .76 m/sec (2.5 ft/sec); 2.3 ft/sec 05/03/20; 06/05/20 Deferred due to R hip pain. 06/19/20 0.52m/s    Time 4    Period Weeks    Status Achieved    Target Date 06/23/20      PT SHORT TERM GOAL #2   Title Pt will ambulate >27' with SPC on varied surfaces mod I for improved community mobility.    Baseline 06/05/20 Ambulation restricted to indoor in gym area using [facility],, deferred outside  ambulaiton due to possibility of exacerbating hip pain. 06/19/20  1000' with SPC on varied surfaces mod I/supervision.    Time 4    Period Weeks    Status Partially Met    Target Date 06/23/20             PT Long Term Goals - 05/24/20 1335      PT LONG TERM GOAL #1   Title Pt will be independent with final HEP and will report full return to home yoga (All LTGs 07/23/20)    Time 8    Period Weeks    Status New    Target Date 07/23/20      PT LONG TERM GOAL #2   Title Pt will report improvement in LE FOTO to >/= 74%    Baseline 62%    Time 8    Period Weeks    Status New      PT LONG TERM GOAL #3   Title Pt will improve 30 second sit to stand to >/= 14 reps to indicate improved functional LE strength    Time 8    Period Weeks    Status New      PT LONG TERM GOAL #4   Title Pt will improve DGI to >/= 20/24 to indicate decreased falls risk    Time 8    Period Weeks    Status New      PT LONG TERM GOAL #5   Title Pt will improve gait velocity without cane to >/= 3.2 ft/sec (or 1 m/sec)    Time 8    Period Weeks    Status New      PT LONG TERM GOAL #6   Title Pt will demonstrate ability to negotiate 2 flights of stairs (24) with 1 or no rail, alternating sequence, MOD I    Time 8    Period Weeks    Status New      PT LONG TERM GOAL #7   Title Pt will initiate return to driving with supervised driving short distances near her home.    Time 8    Period Weeks    Status New                 Plan - 06/19/20 1526    Clinical Impression Statement Pt continues to report feeling steadier and using cane less. PT assessed STGs today with pt meeting gait speed goal of 0.84m/s showing improving community mobility. She partially met gait on varied surfaces goal with  cane needing supervision occasionally as has decreased right foot clearance at times. No reports of pain in right hip today.  Pt will benefit from continued skilled PT to continue to work on strengthening,  balance and gait safety.    Personal Factors and Comorbidities Comorbidity 3+    Comorbidities depression, dizziness/vertigo, osteoporosis    Examination-Activity Limitations Carry;Locomotion Level;Stairs    Examination-Participation Restrictions Community Activity;Driving;Volunteer    Stability/Clinical Decision Making Stable/Uncomplicated    Rehab Potential Good    PT Frequency 2x / week    PT Duration 8 weeks    PT Treatment/Interventions ADLs/Self Care Home Management;Aquatic Therapy;Canalith Repostioning;Electrical Stimulation;DME Instruction;Gait training;Stair training;Functional mobility training;Therapeutic activities;Therapeutic exercise;Balance training;Neuromuscular re-education;Patient/family education;Orthotic Fit/Training;Manual techniques;Passive range of motion;Dry needling;Moist Heat;Cryotherapy;Vestibular    PT Next Visit Plan Monitor R hip pain, functional lateral hip strengthening, gait outside, continue balance training    PT Home Exercise Plan Access Code: [email or web address]    Consulted and Agree with Plan of Care Patient           Patient will benefit from skilled therapeutic intervention in order to improve the following deficits and impairments:  Abnormal gait,Decreased balance,Decreased strength,Difficulty walking,Pain  Visit Diagnosis: Other abnormalities of gait and mobility  Muscle weakness (generalized)     Problem List Patient Active Problem List   Diagnosis Date Noted  . Cerebral thrombosis with cerebral infarction 02/11/2020  . Acute focal neurologic deficit with partial resolution 02/10/2020  . Depression 02/10/2020  . Osteoporosis 02/10/2020    Electa Sniff, PT, DPT, NCS 06/19/2020, 3:30 PM  [facility] [address] [place], [place], [address] Phone: [phone]   Fax:  [phone]  Name: JENEAL VOGL MRN: [id number] Date of Birth: March 17, 1950

## 2020-06-21 ENCOUNTER — Other Ambulatory Visit: Payer: Self-pay

## 2020-06-21 ENCOUNTER — Ambulatory Visit: Payer: Medicare Other

## 2020-06-21 DIAGNOSIS — R2681 Unsteadiness on feet: Secondary | ICD-10-CM

## 2020-06-21 DIAGNOSIS — M6281 Muscle weakness (generalized): Secondary | ICD-10-CM

## 2020-06-21 DIAGNOSIS — R2689 Other abnormalities of gait and mobility: Secondary | ICD-10-CM | POA: Diagnosis not present

## 2020-06-21 DIAGNOSIS — M25551 Pain in right hip: Secondary | ICD-10-CM | POA: Diagnosis not present

## 2020-06-21 NOTE — Patient Instructions (Signed)
Access Code: [email or web address] URL: [email or web address] Date: 06/21/2020 Prepared by: Cherly Anderson  Exercises Clamshell - 2 x daily - 5 x weekly - 2 sets - 10 reps Supine March - 2 x daily - 5 x weekly - 2 sets - 10 reps Walking March - 2 x daily - 5 x weekly - 1 sets - 3-4 reps Backward Walking with Counter Support - 2 x daily - 5 x weekly - 1 sets - 3-4 reps Side Stepping with Counter Support - 2 x daily - 5 x weekly - 1 sets - 3-4 reps Standing head turns - 2 x daily - 7 x weekly - 2 sets - 10 reps Hooklying Single Leg Bent Knee Fallouts with Resistance - 2 x daily - 5 x weekly - 2 sets - 10 reps Supine Bridge - 1 x daily - 7 x weekly - 3 sets - 10 reps

## 2020-06-21 NOTE — Therapy (Signed)
[facility] [address] [place], [place], [address] Phone: [phone]   Fax:  [phone]  Physical Therapy Treatment  Patient Details  Name: Melanie Chen MRN: [id number] Date of Birth: 1950-12-16 Referring Provider (PT): Penni Bombard, MD   Encounter Date: 06/21/2020   PT End of Session - 06/21/20 1108    Visit Number 15    Number of Visits 23    Date for PT Re-Evaluation 07/23/20    Authorization Type Federal BCBS and Medicare; VL PT/OT/ST: 75; 10th visit PN    Progress Note Due on Visit 20    PT Start Time 1106    PT Stop Time 1145    PT Time Calculation (min) 39 min    Equipment Utilized During Treatment Gait belt    Activity Tolerance Patient tolerated treatment well           Past Medical History:  Diagnosis Date  . Colon polyp   . Depression   . Dizziness 09/10/14  . Hemorrhoids    pt unsure  . Osteoporosis     Past Surgical History:  Procedure Laterality Date  . COLONOSCOPY  09/2000  . COLONOSCOPY WITH PROPOFOL N/A 09/22/2019   Procedure: COLONOSCOPY WITH PROPOFOL;  Surgeon: Clarene Essex, MD;  Location: [facility];  Service: Endoscopy;  Laterality: N/A;  . HOT HEMOSTASIS N/A 09/22/2019   Procedure: HOT HEMOSTASIS (ARGON PLASMA COAGULATION/BICAP);  Surgeon: Clarene Essex, MD;  Location: [facility];  Service: Endoscopy;  Laterality: N/A;  . POLYPECTOMY  09/22/2019   Procedure: POLYPECTOMY;  Surgeon: Clarene Essex, MD;  Location: [facility];  Service: Endoscopy;;  . TUBAL LIGATION  1983    There were no vitals filed for this visit.   Subjective Assessment - 06/21/20 1108    Subjective Pt reports she is doing well. Felt good after last session.    Patient is accompained by: Family member    Pertinent History colon polyp, depression, dizziness, osteoporosis    Limitations Walking    Patient Stated Goals Higher energy level; return to driving, RLE to be stronger    Currently in Pain? Yes     Pain Score 2     Pain Location Hip    Pain Orientation Right    Pain Descriptors / Indicators Aching    Pain Type Chronic pain    Pain Onset More than a month ago    Pain Frequency Intermittent                             [facility] Adult PT Treatment/Exercise - 06/21/20 1109      Ambulation/Gait   Ambulation/Gait Yes    Ambulation/Gait Assistance 6: Modified independent (Device/Increase time);5: Supervision    Ambulation/Gait Assistance Details Pt was cued to focus on picking up right foot to not scuff.    Ambulation Distance (Feet) 850 Feet    Assistive device Straight cane    Gait Pattern Step-through pattern;Decreased stance time - right    Ambulation Surface Level;Unlevel;Outdoor;Paved;Grass      Neuro Re-ed    Neuro Re-ed Details  Standing at bottom of steps: on airex feet apart alternating taps on 2nd step x 10 each leg without UE support, standing feet together eyes open x 30 sec, head turns left/right x 10. Staggered stance on airex x 30 sec each position. Increased sway with eyes head turns and staggered stance. Pt reports pain in anterior right hip with right leg  posterior. Standing on rockerboard positioned lateral: trying to maintain level x 30 sec then rocking side to side x 10 without UE support.      Exercises   Exercises Other Exercises    Other Exercises  Sidelying clamshells for right hip x 10 then x 10 with red theraband. Bridges x 10 with verbal cues to engage core to not arch back. Pt reported right hip felt better than she thought it would.                  PT Education - 06/21/20 1259    Education Details Added red theraband resistance to clamshell and hooklying hip flexion. Also changed bridge with single leg lift out for just bridge for better control.    Person(s) Educated Patient    Methods Explanation    Comprehension Verbalized understanding;Returned demonstration            PT Short Term Goals - 06/19/20 1525      PT  SHORT TERM GOAL #1   Title Pt will increase gait velocity to >/= 2.9 ft/sec (or 0.9 m/sec)    Baseline .76 m/sec (2.5 ft/sec); 2.3 ft/sec 05/03/20; 06/05/20 Deferred due to R hip pain. 06/19/20 0.61m/s    Time 4    Period Weeks    Status Achieved    Target Date 06/23/20      PT SHORT TERM GOAL #2   Title Pt will ambulate >45' with SPC on varied surfaces mod I for improved community mobility.    Baseline 06/05/20 Ambulation restricted to indoor in gym area using [facility],, deferred outside ambulaiton due to possibility of exacerbating hip pain. 06/19/20  1000' with SPC on varied surfaces mod I/supervision.    Time 4    Period Weeks    Status Partially Met    Target Date 06/23/20             PT Long Term Goals - 05/24/20 1335      PT LONG TERM GOAL #1   Title Pt will be independent with final HEP and will report full return to home yoga (All LTGs 07/23/20)    Time 8    Period Weeks    Status New    Target Date 07/23/20      PT LONG TERM GOAL #2   Title Pt will report improvement in LE FOTO to >/= 74%    Baseline 62%    Time 8    Period Weeks    Status New      PT LONG TERM GOAL #3   Title Pt will improve 30 second sit to stand to >/= 14 reps to indicate improved functional LE strength    Time 8    Period Weeks    Status New      PT LONG TERM GOAL #4   Title Pt will improve DGI to >/= 20/24 to indicate decreased falls risk    Time 8    Period Weeks    Status New      PT LONG TERM GOAL #5   Title Pt will improve gait velocity without cane to >/= 3.2 ft/sec (or 1 m/sec)    Time 8    Period Weeks    Status New      PT LONG TERM GOAL #6   Title Pt will demonstrate ability to negotiate 2 flights of stairs (24) with 1 or no rail, alternating sequence, MOD I    Time 8    Period Weeks  Status New      PT LONG TERM GOAL #7   Title Pt will initiate return to driving with supervised driving short distances near her home.    Time 8    Period Weeks    Status New                  Plan - 06/21/20 1300    Clinical Impression Statement Pt steadier outside with gait today with less staggering and better right foot clearance. Still decreased right stance time. Slight pain in right hip at times with activities with worse being right leg posterior in staggered stance. Was able to add resistance to supine exercises on HEP.    Personal Factors and Comorbidities Comorbidity 3+    Comorbidities depression, dizziness/vertigo, osteoporosis    Examination-Activity Limitations Carry;Locomotion Level;Stairs    Examination-Participation Restrictions Community Activity;Driving;Volunteer    Stability/Clinical Decision Making Stable/Uncomplicated    Rehab Potential Good    PT Frequency 2x / week    PT Duration 8 weeks    PT Treatment/Interventions ADLs/Self Care Home Management;Aquatic Therapy;Canalith Repostioning;Electrical Stimulation;DME Instruction;Gait training;Stair training;Functional mobility training;Therapeutic activities;Therapeutic exercise;Balance training;Neuromuscular re-education;Patient/family education;Orthotic Fit/Training;Manual techniques;Passive range of motion;Dry needling;Moist Heat;Cryotherapy;Vestibular    PT Next Visit Plan Monitor R hip pain, functional lateral hip strengthening, gait outside, continue balance training    PT Home Exercise Plan Access Code: [email or web address]    Consulted and Agree with Plan of Care Patient           Patient will benefit from skilled therapeutic intervention in order to improve the following deficits and impairments:  Abnormal gait,Decreased balance,Decreased strength,Difficulty walking,Pain  Visit Diagnosis: Other abnormalities of gait and mobility  Muscle weakness (generalized)  Unsteadiness on feet     Problem List Patient Active Problem List   Diagnosis Date Noted  . Cerebral thrombosis with cerebral infarction 02/11/2020  . Acute focal neurologic deficit with partial resolution 02/10/2020  . Depression  02/10/2020  . Osteoporosis 02/10/2020    Electa Sniff, PT, DPT, NCS 06/21/2020, 1:02 PM  [facility] [address] [place], [place], [address] Phone: [phone]   Fax:  [phone]  Name: Melanie Chen MRN: [id number] Date of Birth: 08/22/1950

## 2020-06-26 ENCOUNTER — Ambulatory Visit: Payer: Medicare Other | Attending: Diagnostic Neuroimaging

## 2020-06-26 DIAGNOSIS — R2689 Other abnormalities of gait and mobility: Secondary | ICD-10-CM | POA: Insufficient documentation

## 2020-06-26 DIAGNOSIS — M25551 Pain in right hip: Secondary | ICD-10-CM | POA: Insufficient documentation

## 2020-06-26 DIAGNOSIS — M6281 Muscle weakness (generalized): Secondary | ICD-10-CM | POA: Insufficient documentation

## 2020-06-26 DIAGNOSIS — R2681 Unsteadiness on feet: Secondary | ICD-10-CM | POA: Insufficient documentation

## 2020-06-28 ENCOUNTER — Other Ambulatory Visit: Payer: Self-pay

## 2020-06-28 ENCOUNTER — Ambulatory Visit: Payer: Medicare Other

## 2020-06-28 DIAGNOSIS — R2689 Other abnormalities of gait and mobility: Secondary | ICD-10-CM | POA: Diagnosis not present

## 2020-06-28 DIAGNOSIS — M25551 Pain in right hip: Secondary | ICD-10-CM | POA: Diagnosis not present

## 2020-06-28 DIAGNOSIS — R2681 Unsteadiness on feet: Secondary | ICD-10-CM | POA: Diagnosis not present

## 2020-06-28 DIAGNOSIS — M6281 Muscle weakness (generalized): Secondary | ICD-10-CM | POA: Diagnosis not present

## 2020-06-28 NOTE — Therapy (Signed)
[facility] [address] [place], [place], [address] Phone: [phone]   Fax:  [phone]  Physical Therapy Treatment  Patient Details  Name: Melanie Chen MRN: [id number] Date of Birth: April 24, 1950 Referring Provider (PT): Penni Bombard, MD   Encounter Date: 06/28/2020   PT End of Session - 06/28/20 1026    Visit Number 16    Number of Visits 23    Date for PT Re-Evaluation 07/23/20    Authorization Type Federal BCBS and Medicare; VL PT/OT/ST: 75; 10th visit PN    Progress Note Due on Visit 20    PT Start Time 1025   Pt running behind   PT Stop Time 1103    PT Time Calculation (min) 38 min    Equipment Utilized During Treatment Gait belt    Activity Tolerance Patient tolerated treatment well           Past Medical History:  Diagnosis Date  . Colon polyp   . Depression   . Dizziness 09/10/14  . Hemorrhoids    pt unsure  . Osteoporosis     Past Surgical History:  Procedure Laterality Date  . COLONOSCOPY  09/2000  . COLONOSCOPY WITH PROPOFOL N/A 09/22/2019   Procedure: COLONOSCOPY WITH PROPOFOL;  Surgeon: Clarene Essex, MD;  Location: [facility];  Service: Endoscopy;  Laterality: N/A;  . HOT HEMOSTASIS N/A 09/22/2019   Procedure: HOT HEMOSTASIS (ARGON PLASMA COAGULATION/BICAP);  Surgeon: Clarene Essex, MD;  Location: [facility];  Service: Endoscopy;  Laterality: N/A;  . POLYPECTOMY  09/22/2019   Procedure: POLYPECTOMY;  Surgeon: Clarene Essex, MD;  Location: [facility];  Service: Endoscopy;;  . TUBAL LIGATION  1983    There were no vitals filed for this visit.   Subjective Assessment - 06/28/20 1026    Subjective Pt reports she has been doing really good.    Patient is accompained by: Family member    Pertinent History colon polyp, depression, dizziness, osteoporosis    Limitations Walking    Patient Stated Goals Higher energy level; return to driving, RLE to be stronger    Currently in Pain? Yes     Pain Score 0-No pain    Pain Location Hip    Pain Orientation Right    Pain Descriptors / Indicators --   knows it is there   Pain Type Chronic pain    Pain Onset More than a month ago    Pain Frequency Intermittent                             [facility] Adult PT Treatment/Exercise - 06/28/20 1029      Ambulation/Gait   Ambulation/Gait Yes    Ambulation/Gait Assistance 6: Modified independent (Device/Increase time);5: Supervision    Ambulation/Gait Assistance Details Pt had 1 episode of staggering tot he left when looked to right needing CGA touching wall.    Ambulation Distance (Feet) 850 Feet    Assistive device Straight cane    Gait Pattern Step-through pattern    Ambulation Surface Level;Unlevel;Indoor;Outdoor;Paved;Grass      Neuro Re-ed    Neuro Re-ed Details  In // bars: standing on rockerboard positioned ant/post trying to maintain level without UE support eyes open x 30 sec then x 30 sec eyes closed with CGA with increased sway, rocking board ant/post x 10 with verbal cues to perform more weight shift through toes and not bend knees, alternating toe taps on cone with  1 UE support x 15 with verbal cues to tigthen gluts especially on left to stabilize more and slow down. Rockerboard positioned lateral: trying to maintain level x 30 sec eyes open, head turns left/right x 10 CGA with decreased stability leaning away from side she was looking. Dynamic gait activities with cane: weaving in and out of 6 cones with tight turns x 2 bouts normal pace then x 2 bouts with cues to try to move a little faster. Pt challenged with going faster and if looked up at all. Gait with head turns left/right on cue x 50', up/down x 50'. Horizontal head turns more difficult with slower speed. Also slowed some with looking up but didn't report feeling less steady looking up. Marching gait 115' with CGA. Pt reported being tired after marching.                    PT Short Term Goals  - 06/19/20 1525      PT SHORT TERM GOAL #1   Title Pt will increase gait velocity to >/= 2.9 ft/sec (or 0.9 m/sec)    Baseline .76 m/sec (2.5 ft/sec); 2.3 ft/sec 05/03/20; 06/05/20 Deferred due to R hip pain. 06/19/20 0.60m/s    Time 4    Period Weeks    Status Achieved    Target Date 06/23/20      PT SHORT TERM GOAL #2   Title Pt will ambulate >4' with SPC on varied surfaces mod I for improved community mobility.    Baseline 06/05/20 Ambulation restricted to indoor in gym area using [facility],, deferred outside ambulaiton due to possibility of exacerbating hip pain. 06/19/20  1000' with SPC on varied surfaces mod I/supervision.    Time 4    Period Weeks    Status Partially Met    Target Date 06/23/20             PT Long Term Goals - 05/24/20 1335      PT LONG TERM GOAL #1   Title Pt will be independent with final HEP and will report full return to home yoga (All LTGs 07/23/20)    Time 8    Period Weeks    Status New    Target Date 07/23/20      PT LONG TERM GOAL #2   Title Pt will report improvement in LE FOTO to >/= 74%    Baseline 62%    Time 8    Period Weeks    Status New      PT LONG TERM GOAL #3   Title Pt will improve 30 second sit to stand to >/= 14 reps to indicate improved functional LE strength    Time 8    Period Weeks    Status New      PT LONG TERM GOAL #4   Title Pt will improve DGI to >/= 20/24 to indicate decreased falls risk    Time 8    Period Weeks    Status New      PT LONG TERM GOAL #5   Title Pt will improve gait velocity without cane to >/= 3.2 ft/sec (or 1 m/sec)    Time 8    Period Weeks    Status New      PT LONG TERM GOAL #6   Title Pt will demonstrate ability to negotiate 2 flights of stairs (24) with 1 or no rail, alternating sequence, MOD I    Time 8    Period Weeks  Status New      PT LONG TERM GOAL #7   Title Pt will initiate return to driving with supervised driving short distances near her home.    Time 8    Period Weeks     Status New                 Plan - 06/28/20 1111    Clinical Impression Statement Pt continues to be more challenged with head turns horizontally during gait as tends to stagger away from direction she is walking at times. PT focused on balance activities incorporating this more.    Personal Factors and Comorbidities Comorbidity 3+    Comorbidities depression, dizziness/vertigo, osteoporosis    Examination-Activity Limitations Carry;Locomotion Level;Stairs    Examination-Participation Restrictions Community Activity;Driving;Volunteer    Stability/Clinical Decision Making Stable/Uncomplicated    Rehab Potential Good    PT Frequency 2x / week    PT Duration 8 weeks    PT Treatment/Interventions ADLs/Self Care Home Management;Aquatic Therapy;Canalith Repostioning;Electrical Stimulation;DME Instruction;Gait training;Stair training;Functional mobility training;Therapeutic activities;Therapeutic exercise;Balance training;Neuromuscular re-education;Patient/family education;Orthotic Fit/Training;Manual techniques;Passive range of motion;Dry needling;Moist Heat;Cryotherapy;Vestibular    PT Next Visit Plan Monitor R hip pain, functional lateral hip strengthening, gait outside, continue balance training. Work on balance on compliant surfaces and gait with horizontal head turns.    PT Home Exercise Plan Access Code: [email or web address]    Consulted and Agree with Plan of Care Patient           Patient will benefit from skilled therapeutic intervention in order to improve the following deficits and impairments:  Abnormal gait,Decreased balance,Decreased strength,Difficulty walking,Pain  Visit Diagnosis: Other abnormalities of gait and mobility  Muscle weakness (generalized)  Unsteadiness on feet     Problem List Patient Active Problem List   Diagnosis Date Noted  . Cerebral thrombosis with cerebral infarction 02/11/2020  . Acute focal neurologic deficit with partial resolution 02/10/2020  .  Depression 02/10/2020  . Osteoporosis 02/10/2020    Electa Sniff, PT, DPT, NCS 06/28/2020, 11:13 AM  [facility] [address] [place], [place], [address] Phone: [phone]   Fax:  [phone]  Name: Melanie Chen MRN: [id number] Date of Birth: 04-05-1950

## 2020-07-05 ENCOUNTER — Other Ambulatory Visit: Payer: Self-pay

## 2020-07-05 ENCOUNTER — Ambulatory Visit: Payer: Medicare Other

## 2020-07-05 ENCOUNTER — Other Ambulatory Visit: Payer: Self-pay | Admitting: Diagnostic Neuroimaging

## 2020-07-05 DIAGNOSIS — R2681 Unsteadiness on feet: Secondary | ICD-10-CM | POA: Diagnosis not present

## 2020-07-05 DIAGNOSIS — R2689 Other abnormalities of gait and mobility: Secondary | ICD-10-CM

## 2020-07-05 DIAGNOSIS — M25551 Pain in right hip: Secondary | ICD-10-CM | POA: Diagnosis not present

## 2020-07-05 DIAGNOSIS — M6281 Muscle weakness (generalized): Secondary | ICD-10-CM | POA: Diagnosis not present

## 2020-07-05 NOTE — Therapy (Signed)
[facility] [address] [place], [place], [address] Phone: [phone]   Fax:  [phone]  Physical Therapy Treatment  Patient Details  Name: Melanie Chen MRN: [id number] Date of Birth: [birth year] Referring Provider (PT): Penni Bombard, MD   Encounter Date: 07/05/2020   PT End of Session - 07/05/20 1111    Visit Number 17    Number of Visits 23    Date for PT Re-Evaluation 07/23/20    Authorization Type Federal BCBS and Medicare; VL PT/OT/ST: 75; 10th visit PN    Progress Note Due on Visit 20    PT Start Time 1110   arrived a little late   PT Stop Time 1144    PT Time Calculation (min) 34 min    Equipment Utilized During Treatment Gait belt    Activity Tolerance Patient tolerated treatment well           Past Medical History:  Diagnosis Date  . Colon polyp   . Depression   . Dizziness 09/10/14  . Hemorrhoids    pt unsure  . Osteoporosis     Past Surgical History:  Procedure Laterality Date  . COLONOSCOPY  09/2000  . COLONOSCOPY WITH PROPOFOL N/A 09/22/2019   Procedure: COLONOSCOPY WITH PROPOFOL;  Surgeon: Clarene Essex, MD;  Location: [facility];  Service: Endoscopy;  Laterality: N/A;  . HOT HEMOSTASIS N/A 09/22/2019   Procedure: HOT HEMOSTASIS (ARGON PLASMA COAGULATION/BICAP);  Surgeon: Clarene Essex, MD;  Location: [facility];  Service: Endoscopy;  Laterality: N/A;  . POLYPECTOMY  09/22/2019   Procedure: POLYPECTOMY;  Surgeon: Clarene Essex, MD;  Location: [facility];  Service: Endoscopy;;  . TUBAL LIGATION  1983    There were no vitals filed for this visit.   Subjective Assessment - 07/05/20 1111    Subjective Pt reports she has been doing really good.    Patient is accompained by: Family member    Pertinent History colon polyp, depression, dizziness, osteoporosis    Limitations Walking    Patient Stated Goals Higher energy level; return to driving, RLE to be stronger    Currently in  Pain? No/denies    Pain Onset More than a month ago                             [facility] Adult PT Treatment/Exercise - 07/05/20 1111      Ambulation/Gait   Ambulation/Gait Yes    Ambulation/Gait Assistance 6: Modified independent (Device/Increase time)    Ambulation/Gait Assistance Details Pt did not have Melanie episodes of scuffing right foot. Does have some increased lateral sway at times.    Ambulation Distance (Feet) 850 Feet    Assistive device Straight cane    Gait Pattern Step-through pattern    Ambulation Surface Level;Unlevel;Indoor;Outdoor;Paved;Grass      Neuro Re-ed    Neuro Re-ed Details  Gait over obstacles: weaving in and out of 5 cones then reciprocal steps over 4 hurdles x 6 bouts then alternating taps on cones prior to stepping over then x 4 then over hurdles x 4 bouts CGA/ occasional min assist on hurdles. Near counter: marching gait over blue mat x 6 bouts, tandem stance 30 sec x 2 each position. Pt most challenged with RLE posterior but decreased stability with both. Posterior trust falls working on stepping strategy x 5. Pt initially grabbing for counter then started to take step with LLE.  PT Short Term Goals - 06/19/20 1525      PT SHORT TERM GOAL #1   Title Pt will increase gait velocity to >/= 2.9 ft/sec (or 0.9 m/sec)    Baseline .76 m/sec (2.5 ft/sec); 2.3 ft/sec 05/03/20; 06/05/20 Deferred due to R hip pain. 06/19/20 0.5ms    Time 4    Period Weeks    Status Achieved    Target Date 06/23/20      PT SHORT TERM GOAL #2   Title Pt will ambulate >>27 with SPC on varied surfaces mod I for improved community mobility.    Baseline 06/05/20 Ambulation restricted to indoor in gym area using [facility], deferred outside ambulaiton due to possibility of exacerbating hip pain. 06/19/20  1000' with SPC on varied surfaces mod I/supervision.    Time 4    Period Weeks    Status Partially Met    Target Date 06/23/20             PT  Long Term Goals - 05/24/20 1335      PT LONG TERM GOAL #1   Title Pt will be independent with final HEP and will report full return to home yoga (All LTGs 07/23/20)    Time 8    Period Weeks    Status New    Target Date 07/23/20      PT LONG TERM GOAL #2   Title Pt will report improvement in LE FOTO to >/= 74%    Baseline 62%    Time 8    Period Weeks    Status New      PT LONG TERM GOAL #3   Title Pt will improve 30 second sit to stand to >/= 14 reps to indicate improved functional LE strength    Time 8    Period Weeks    Status New      PT LONG TERM GOAL #4   Title Pt will improve DGI to >/= 20/24 to indicate decreased falls risk    Time 8    Period Weeks    Status New      PT LONG TERM GOAL #5   Title Pt will improve gait velocity without cane to >/= 3.2 ft/sec (or 1 m/sec)    Time 8    Period Weeks    Status New      PT LONG TERM GOAL #6   Title Pt will demonstrate ability to negotiate 2 flights of stairs (24) with 1 or no rail, alternating sequence, MOD I    Time 8    Period Weeks    Status New      PT LONG TERM GOAL #7   Title Pt will initiate return to driving with supervised driving short distances near her home.    Time 8    Period Weeks    Status New                 Plan - 07/05/20 1853    Clinical Impression Statement Pt tolerated session well with no increased pain in right hip area. She demonstrated better foot clearance with gait. Is challenged with increasing SLS time especially on right and with tandem stance on compliant surface.    Personal Factors and Comorbidities Comorbidity 3+    Comorbidities depression, dizziness/vertigo, osteoporosis    Examination-Activity Limitations Carry;Locomotion Level;Stairs    Examination-Participation Restrictions Community Activity;Driving;Volunteer    Stability/Clinical Decision Making Stable/Uncomplicated    Rehab Potential Good    PT Frequency 2x /  week    PT Duration 8 weeks    PT  Treatment/Interventions ADLs/Self Care Home Management;Aquatic Therapy;Canalith Repostioning;Electrical Stimulation;DME Instruction;Gait training;Stair training;Functional mobility training;Therapeutic activities;Therapeutic exercise;Balance training;Neuromuscular re-education;Patient/family education;Orthotic Fit/Training;Manual techniques;Passive range of motion;Dry needling;Moist Heat;Cryotherapy;Vestibular    PT Next Visit Plan Continue to work more on stepping strategy. Functional lateral hip strengthening, gait on varied surfaces with and without cane. SLS activities.  Work on balance on compliant surfaces and gait with horizontal head turns.    PT Home Exercise Plan Access Code: [email or web address]    Consulted and Agree with Plan of Care Patient           Patient will benefit from skilled therapeutic intervention in order to improve the following deficits and impairments:  Abnormal gait,Decreased balance,Decreased strength,Difficulty walking,Pain  Visit Diagnosis: Other abnormalities of gait and mobility  Muscle weakness (generalized)  Unsteadiness on feet     Problem List Patient Active Problem List   Diagnosis Date Noted  . Cerebral thrombosis with cerebral infarction 02/11/2020  . Acute focal neurologic deficit with partial resolution 02/10/2020  . Depression 02/10/2020  . Osteoporosis 02/10/2020    Electa Sniff, PT, DPT, NCS 07/05/2020, 6:56 PM  [facility] [address] [place], [place], [address] Phone: [phone]   Fax:  [phone]  Name: Melanie Chen MRN: [id number] Date of Birth: April 27, 1950

## 2020-07-06 ENCOUNTER — Encounter: Payer: Self-pay | Admitting: *Deleted

## 2020-07-07 ENCOUNTER — Ambulatory Visit: Payer: Medicare Other

## 2020-07-07 ENCOUNTER — Other Ambulatory Visit: Payer: Self-pay

## 2020-07-07 DIAGNOSIS — M6281 Muscle weakness (generalized): Secondary | ICD-10-CM | POA: Diagnosis not present

## 2020-07-07 DIAGNOSIS — R2689 Other abnormalities of gait and mobility: Secondary | ICD-10-CM

## 2020-07-07 DIAGNOSIS — R2681 Unsteadiness on feet: Secondary | ICD-10-CM | POA: Diagnosis not present

## 2020-07-07 DIAGNOSIS — M25551 Pain in right hip: Secondary | ICD-10-CM | POA: Diagnosis not present

## 2020-07-07 NOTE — Therapy (Signed)
[facility] [address] [place], [place], [address] Phone: [phone]   Fax:  [phone]  Physical Therapy Treatment  Patient Details  Name: Melanie Chen MRN: [id number] Date of Birth: 1950/11/13 Referring Provider (PT): Penni Bombard, MD   Encounter Date: 07/07/2020   PT End of Session - 07/07/20 1420    Visit Number 18    Number of Visits 23    Date for PT Re-Evaluation 07/23/20    Authorization Type Federal BCBS and Medicare; VL PT/OT/ST: 75; 10th visit PN    Progress Note Due on Visit 20    PT Start Time 1320    PT Stop Time 1400    PT Time Calculation (min) 40 min    Equipment Utilized During Treatment Gait belt    Activity Tolerance Patient tolerated treatment well    Behavior During Therapy WFL for tasks assessed/performed           Past Medical History:  Diagnosis Date  . Colon polyp   . Depression   . Dizziness 09/10/14  . Hemorrhoids    pt unsure  . Osteoporosis     Past Surgical History:  Procedure Laterality Date  . COLONOSCOPY  09/2000  . COLONOSCOPY WITH PROPOFOL N/A 09/22/2019   Procedure: COLONOSCOPY WITH PROPOFOL;  Surgeon: Clarene Essex, MD;  Location: [facility];  Service: Endoscopy;  Laterality: N/A;  . HOT HEMOSTASIS N/A 09/22/2019   Procedure: HOT HEMOSTASIS (ARGON PLASMA COAGULATION/BICAP);  Surgeon: Clarene Essex, MD;  Location: [facility];  Service: Endoscopy;  Laterality: N/A;  . POLYPECTOMY  09/22/2019   Procedure: POLYPECTOMY;  Surgeon: Clarene Essex, MD;  Location: [facility];  Service: Endoscopy;;  . TUBAL LIGATION  1983    There were no vitals filed for this visit.   Subjective Assessment - 07/07/20 1321    Subjective Contiues to report gains and progress, has an appointment with PCP to address ongoing R hip issues    Patient is accompained by: Family member    Pertinent History colon polyp, depression, dizziness, osteoporosis    Limitations Walking    Patient  Stated Goals Higher energy level; return to driving, RLE to be stronger    Pain Onset More than a month ago             Additional time spent with patient ensuring proper follow through with balance tasks using verbal and tactile cues as needed                [facility] Adult PT Treatment/Exercise - 07/07/20 0001      Ambulation/Gait   Ambulation/Gait Yes    Ambulation/Gait Assistance 6: Modified independent (Device/Increase time)    Ambulation/Gait Assistance Details No toe scuff observed R    Ambulation Distance (Feet) 460 Feet    Assistive device None    Gait Pattern Step-through pattern;Decreased arm swing - right    Ambulation Surface Level;Indoor      Neuro Re-ed    Neuro Re-ed Details  performed standing balance tasks emphasizing weight shifts from COG and pelvic girdle vs. shoulder/upper trunk               Balance Exercises - 07/07/20 0001      Balance Exercises: Standing   Other Standing Exercises shoulder abduction at wall with contralateral hip abduction, 10x ea., tandem stand at counter WS pelvis fwd/bwd 10x2, tandem stand with support and sidebending 5x2, tandem stand with fwd WS and OH flexion with inspiration  Other Standing Exercises Comments self strust falls against wall increasing distance from wall as patient accomodated x2'             PT Education - 07/07/20 1419    Education Details Added shoulder abduction slides on wall with cotralateral hip abduction    Person(s) Educated Patient    Methods Explanation;Demonstration;Tactile cues;Verbal cues;Handout    Comprehension Verbalized understanding;Returned demonstration;Tactile cues required;Need further instruction;Verbal cues required            PT Short Term Goals - 06/19/20 1525      PT SHORT TERM GOAL #1   Title Pt will increase gait velocity to >/= 2.9 ft/sec (or 0.9 m/sec)    Baseline .76 m/sec (2.5 ft/sec); 2.3 ft/sec 05/03/20; 06/05/20 Deferred due to R hip pain. 06/19/20  0.75m/s    Time 4    Period Weeks    Status Achieved    Target Date 06/23/20      PT SHORT TERM GOAL #2   Title Pt will ambulate >7' with SPC on varied surfaces mod I for improved community mobility.    Baseline 06/05/20 Ambulation restricted to indoor in gym area using [facility],, deferred outside ambulaiton due to possibility of exacerbating hip pain. 06/19/20  1000' with SPC on varied surfaces mod I/supervision.    Time 4    Period Weeks    Status Partially Met    Target Date 06/23/20             PT Long Term Goals - 05/24/20 1335      PT LONG TERM GOAL #1   Title Pt will be independent with final HEP and will report full return to home yoga (All LTGs 07/23/20)    Time 8    Period Weeks    Status New    Target Date 07/23/20      PT LONG TERM GOAL #2   Title Pt will report improvement in LE FOTO to >/= 74%    Baseline 62%    Time 8    Period Weeks    Status New      PT LONG TERM GOAL #3   Title Pt will improve 30 second sit to stand to >/= 14 reps to indicate improved functional LE strength    Time 8    Period Weeks    Status New      PT LONG TERM GOAL #4   Title Pt will improve DGI to >/= 20/24 to indicate decreased falls risk    Time 8    Period Weeks    Status New      PT LONG TERM GOAL #5   Title Pt will improve gait velocity without cane to >/= 3.2 ft/sec (or 1 m/sec)    Time 8    Period Weeks    Status New      PT LONG TERM GOAL #6   Title Pt will demonstrate ability to negotiate 2 flights of stairs (24) with 1 or no rail, alternating sequence, MOD I    Time 8    Period Weeks    Status New      PT LONG TERM GOAL #7   Title Pt will initiate return to driving with supervised driving short distances near her home.    Time 8    Period Weeks    Status New                 Plan - 07/07/20 1421    Clinical  Impression Statement Todays session consisted of balance training exercises and activities desinged to simulate SLS tasks, R hip pain discomfort  not a limiting factor but present, unable to SLS with toe touch and adopted tandem satnce for all activities, R hip symptoms more prominent when hip in closed pack position.  Todays treatment performed w/o using cane    Personal Factors and Comorbidities Comorbidity 3+    Comorbidities depression, dizziness/vertigo, osteoporosis    Examination-Activity Limitations Carry;Locomotion Level;Stairs    Examination-Participation Restrictions Community Activity;Driving;Volunteer    Stability/Clinical Decision Making Stable/Uncomplicated    Rehab Potential Good    PT Frequency 2x / week    PT Duration 8 weeks    PT Treatment/Interventions ADLs/Self Care Home Management;Aquatic Therapy;Canalith Repostioning;Electrical Stimulation;DME Instruction;Gait training;Stair training;Functional mobility training;Therapeutic activities;Therapeutic exercise;Balance training;Neuromuscular re-education;Patient/family education;Orthotic Fit/Training;Manual techniques;Passive range of motion;Dry needling;Moist Heat;Cryotherapy;Vestibular    PT Next Visit Plan Continue to work more on stepping strategy. Functional lateral hip strengthening, gait on varied surfaces with and without cane. SLS activities.  Work on balance on compliant surfaces and gait with horizontal head turns.    PT Home Exercise Plan Access Code: [email or web address]    Consulted and Agree with Plan of Care Patient           Patient will benefit from skilled therapeutic intervention in order to improve the following deficits and impairments:  Abnormal gait,Decreased balance,Decreased strength,Difficulty walking,Pain  Visit Diagnosis: Unsteadiness on feet  Other abnormalities of gait and mobility  Muscle weakness (generalized)  Pain in right hip     Problem List Patient Active Problem List   Diagnosis Date Noted  . Cerebral thrombosis with cerebral infarction 02/11/2020  . Acute focal neurologic deficit with partial resolution 02/10/2020  . Depression  02/10/2020  . Osteoporosis 02/10/2020    Lanice Shirts 07/07/2020, 2:34 PM  [facility] [address] [place], [place], [address] Phone: [phone]   Fax:  [phone]  Name: Melanie Chen MRN: [id number] Date of Birth: April 25, 1950

## 2020-07-07 NOTE — Patient Instructions (Addendum)
Access Code: [email or web address] URL: [email or web address] Date: 07/07/2020 Prepared by: Sharlynn Oliphant  Exercises Clamshell - 2 x daily - 5 x weekly - 2 sets - 10 reps Supine March - 2 x daily - 5 x weekly - 2 sets - 10 reps Walking March - 2 x daily - 5 x weekly - 1 sets - 3-4 reps Backward Walking with Counter Support - 2 x daily - 5 x weekly - 1 sets - 3-4 reps Side Stepping with Counter Support - 2 x daily - 5 x weekly - 1 sets - 3-4 reps Standing head turns - 2 x daily - 7 x weekly - 2 sets - 10 reps Hooklying Single Leg Bent Knee Fallouts with Resistance - 2 x daily - 5 x weekly - 2 sets - 10 reps Supine Bridge - 1 x daily - 7 x weekly - 3 sets - 10 reps Standing Shoulder Abduction Slides at Wall - 2 x daily - 7 x weekly - 3 sets - 10 reps - 2 hold

## 2020-07-12 ENCOUNTER — Ambulatory Visit: Payer: Medicare Other

## 2020-07-12 ENCOUNTER — Other Ambulatory Visit: Payer: Self-pay

## 2020-07-12 VITALS — BP 120/78

## 2020-07-12 DIAGNOSIS — M6281 Muscle weakness (generalized): Secondary | ICD-10-CM | POA: Diagnosis not present

## 2020-07-12 DIAGNOSIS — R2681 Unsteadiness on feet: Secondary | ICD-10-CM

## 2020-07-12 DIAGNOSIS — R2689 Other abnormalities of gait and mobility: Secondary | ICD-10-CM

## 2020-07-12 DIAGNOSIS — M25551 Pain in right hip: Secondary | ICD-10-CM | POA: Diagnosis not present

## 2020-07-12 NOTE — Therapy (Signed)
[facility] [address] [place], [place], [address] Phone: [phone]   Fax:  [phone]  Physical Therapy Treatment  Patient Details  Name: Melanie Chen MRN: [id number] Date of Birth: February 09, 1951 Referring Provider (PT): Penni Bombard, MD   Encounter Date: 07/12/2020   PT End of Session - 07/12/20 1106    Visit Number 19    Number of Visits 23    Date for PT Re-Evaluation 07/23/20    Authorization Type Federal BCBS and Medicare; VL PT/OT/ST: 75; 10th visit PN    Progress Note Due on Visit 20    PT Start Time 1104    PT Stop Time 1143    PT Time Calculation (min) 39 min    Equipment Utilized During Treatment Gait belt    Activity Tolerance Patient tolerated treatment well    Behavior During Therapy WFL for tasks assessed/performed           Past Medical History:  Diagnosis Date  . Colon polyp   . Depression   . Dizziness 09/10/14  . Hemorrhoids    pt unsure  . Osteoporosis     Past Surgical History:  Procedure Laterality Date  . COLONOSCOPY  09/2000  . COLONOSCOPY WITH PROPOFOL N/A 09/22/2019   Procedure: COLONOSCOPY WITH PROPOFOL;  Surgeon: Clarene Essex, MD;  Location: [facility];  Service: Endoscopy;  Laterality: N/A;  . HOT HEMOSTASIS N/A 09/22/2019   Procedure: HOT HEMOSTASIS (ARGON PLASMA COAGULATION/BICAP);  Surgeon: Clarene Essex, MD;  Location: [facility];  Service: Endoscopy;  Laterality: N/A;  . POLYPECTOMY  09/22/2019   Procedure: POLYPECTOMY;  Surgeon: Clarene Essex, MD;  Location: [facility];  Service: Endoscopy;;  . TUBAL LIGATION  1983    Vitals:   07/12/20 1108  BP: 120/78     Subjective Assessment - 07/12/20 1106    Subjective Pt reports that she is a little tired today. Was walking at [facility] and felt it come on. Doesn't necessarily feel bad just tired. Did sleep well last night. Hip about the same. She goes to the nurse practitioner tomorrow about her hip.    Patient is  accompained by: Family member    Pertinent History colon polyp, depression, dizziness, osteoporosis    Limitations Walking    Patient Stated Goals Higher energy level; return to driving, RLE to be stronger    Currently in Pain? Yes    Pain Score 2     Pain Location Hip    Pain Orientation Right    Pain Descriptors / Indicators Sore    Pain Type Chronic pain    Pain Onset More than a month ago    Pain Frequency Intermittent                             [facility] Adult PT Treatment/Exercise - 07/12/20 1108      Ambulation/Gait   Ambulation/Gait Yes    Ambulation/Gait Assistance 6: Modified independent (Device/Increase time);5: Supervision    Ambulation/Gait Assistance Details Pt ambulated outside over grass including up/down grassy hill.    Ambulation Distance (Feet) 1250 Feet    Assistive device Straight cane    Gait Pattern Step-through pattern    Ambulation Surface Level;Unlevel;Indoor;Outdoor;Paved;Grass    Stairs Yes    Stairs Assistance 5: Supervision    Stair Management Technique Alternating pattern;With cane    Number of Stairs 8    Height of Stairs 6  Neuro Re-ed    Neuro Re-ed Details  Standing on rockerboard positioned lateral next to counter: maintaining level x 30 sec then adding in reach across body for clip and placing on other side at top of pole x 6 then taking off and placing back on table with other hand. Repeated twice. Pt reported it was more challenging when using right hand and did have increased rocking on board.                  PT Education - 07/12/20 1435    Education Details Pt discussed possible recert next week with decreasing frequency to continue to work on balance.    Person(s) Educated Patient    Methods Explanation    Comprehension Verbalized understanding            PT Short Term Goals - 06/19/20 1525      PT SHORT TERM GOAL #1   Title Pt will increase gait velocity to >/= 2.9 ft/sec (or 0.9 m/sec)     Baseline .76 m/sec (2.5 ft/sec); 2.3 ft/sec 05/03/20; 06/05/20 Deferred due to R hip pain. 06/19/20 0.66ms    Time 4    Period Weeks    Status Achieved    Target Date 06/23/20      PT SHORT TERM GOAL #2   Title Pt will ambulate >>7 with SPC on varied surfaces mod I for improved community mobility.    Baseline 06/05/20 Ambulation [facility], deferred outside ambulaiton due to possibility of exacerbating hip pain. 06/19/20  1000' with SPC on varied surfaces mod I/supervision.    Time 4    Period Weeks    Status Partially Met    Target Date 06/23/20             PT Long Term Goals - 05/24/20 1335      PT LONG TERM GOAL #1   Title Pt will be independent with final HEP and will report full return to home yoga (All LTGs 07/23/20)    Time 8    Period Weeks    Status New    Target Date 07/23/20      PT LONG TERM GOAL #2   Title Pt will report improvement in LE FOTO to >/= 74%    Baseline 62%    Time 8    Period Weeks    Status New      PT LONG TERM GOAL #3   Title Pt will improve 30 second sit to stand to >/= 14 reps to indicate improved functional LE strength    Time 8    Period Weeks    Status New      PT LONG TERM GOAL #4   Title Pt will improve DGI to >/= 20/24 to indicate decreased falls risk    Time 8    Period Weeks    Status New      PT LONG TERM GOAL #5   Title Pt will improve gait velocity without cane to >/= 3.2 ft/sec (or 1 m/sec)    Time 8    Period Weeks    Status New      PT LONG TERM GOAL #6   Title Pt will demonstrate ability to negotiate 2 flights of stairs (24) with 1 or no rail, alternating sequence, MOD I    Time 8    Period Weeks    Status New      PT LONG TERM GOAL #7  Title Pt will initiate return to driving with supervised driving short distances near her home.    Time 8    Period Weeks    Status New                 Plan - 07/12/20 1436    Clinical Impression Statement PT continued to progress  gait on varied surfaces. Pt does at times stagger but overall improving on her stability even on grassy hill negotiation today.    Personal Factors and Comorbidities Comorbidity 3+    Comorbidities depression, dizziness/vertigo, osteoporosis    Examination-Activity Limitations Carry;Locomotion Level;Stairs    Examination-Participation Restrictions Community Activity;Driving;Volunteer    Stability/Clinical Decision Making Stable/Uncomplicated    Rehab Potential Good    PT Frequency 2x / week    PT Duration 8 weeks    PT Treatment/Interventions ADLs/Self Care Home Management;Aquatic Therapy;Canalith Repostioning;Electrical Stimulation;DME Instruction;Gait training;Stair training;Functional mobility training;Therapeutic activities;Therapeutic exercise;Balance training;Neuromuscular re-education;Patient/family education;Orthotic Fit/Training;Manual techniques;Passive range of motion;Dry needling;Moist Heat;Cryotherapy;Vestibular    PT Next Visit Plan Schedule 1x/week for 8 weeks past next week as will plan to recert next week.10th visit progress note next visit.  Continue to work more on stepping strategy. Functional lateral hip strengthening, gait on varied surfaces with and without cane. SLS activities.  Work on balance on compliant surfaces and gait with horizontal head turns.    PT Home Exercise Plan Access Code: [email or web address]    Consulted and Agree with Plan of Care Patient           Patient will benefit from skilled therapeutic intervention in order to improve the following deficits and impairments:  Abnormal gait,Decreased balance,Decreased strength,Difficulty walking,Pain  Visit Diagnosis: Other abnormalities of gait and mobility  Unsteadiness on feet  Muscle weakness (generalized)     Problem List Patient Active Problem List   Diagnosis Date Noted  . Cerebral thrombosis with cerebral infarction 02/11/2020  . Acute focal neurologic deficit with partial resolution 02/10/2020  .  Depression 02/10/2020  . Osteoporosis 02/10/2020    Electa Sniff, PT, DPT, NCS 07/12/2020, 2:39 PM  [facility] [address] [place], [place], [address] Phone: [phone]   Fax:  [phone]  Name: Melanie Chen MRN: [id number] Date of Birth: 15-Nov-1950

## 2020-07-13 DIAGNOSIS — M25551 Pain in right hip: Secondary | ICD-10-CM | POA: Diagnosis not present

## 2020-07-14 ENCOUNTER — Other Ambulatory Visit: Payer: Self-pay

## 2020-07-14 ENCOUNTER — Ambulatory Visit: Payer: Medicare Other

## 2020-07-14 DIAGNOSIS — R2681 Unsteadiness on feet: Secondary | ICD-10-CM

## 2020-07-14 DIAGNOSIS — M6281 Muscle weakness (generalized): Secondary | ICD-10-CM

## 2020-07-14 DIAGNOSIS — R2689 Other abnormalities of gait and mobility: Secondary | ICD-10-CM

## 2020-07-14 DIAGNOSIS — M25551 Pain in right hip: Secondary | ICD-10-CM | POA: Diagnosis not present

## 2020-07-14 NOTE — Therapy (Addendum)
[facility] [address] [place], [place], [address] Phone: [phone]   Fax:  [phone]  Physical Therapy Treatment/Progress Note  Patient Details  Name: Melanie Chen MRN: [id number] Date of Birth: 10/29/1950 Referring Provider (PT): Penni Bombard, MD  Physical Therapy Progress Note   Dates of Reporting Period:04/24/20-07/14/20  See Note below for Objective Data and Assessment of Progress/Goals.    PT End of Session - 07/14/20 1346    Visit Number 20    Number of Visits 23    Date for PT Re-Evaluation 07/23/20    Authorization Type Federal BCBS and Medicare; VL PT/OT/ST: 75; 10th visit PN    Progress Note Due on Visit 20    PT Start Time 1230    PT Stop Time 1315    PT Time Calculation (min) 45 min    Equipment Utilized During Treatment Gait belt    Activity Tolerance Patient tolerated treatment well    Behavior During Therapy WFL for tasks assessed/performed           Past Medical History:  Diagnosis Date  . Colon polyp   . Depression   . Dizziness 09/10/14  . Hemorrhoids    pt unsure  . Osteoporosis     Past Surgical History:  Procedure Laterality Date  . COLONOSCOPY  09/2000  . COLONOSCOPY WITH PROPOFOL N/A 09/22/2019   Procedure: COLONOSCOPY WITH PROPOFOL;  Surgeon: Clarene Essex, MD;  Location: [facility];  Service: Endoscopy;  Laterality: N/A;  . HOT HEMOSTASIS N/A 09/22/2019   Procedure: HOT HEMOSTASIS (ARGON PLASMA COAGULATION/BICAP);  Surgeon: Clarene Essex, MD;  Location: [facility];  Service: Endoscopy;  Laterality: N/A;  . POLYPECTOMY  09/22/2019   Procedure: POLYPECTOMY;  Surgeon: Clarene Essex, MD;  Location: [facility];  Service: Endoscopy;;  . TUBAL LIGATION  1983    There were no vitals filed for this visit.   Subjective Assessment - 07/14/20 1232    Subjective Saw MD and had x-ray of R hip, awaiting results    Patient is accompained by: Family member    Pertinent History  colon polyp, depression, dizziness, osteoporosis    Limitations Walking    Patient Stated Goals Higher energy level; return to driving, RLE to be stronger    Pain Onset More than a month ago              [facility] PT Assessment - 07/14/20 0001      Ambulation/Gait   Gait velocity 0.90 m/s      Dynamic Gait Index   Level Surface Normal    Change in Gait Speed Normal    Gait with Horizontal Head Turns Normal    Gait with Vertical Head Turns Normal    Gait and Pivot Turn Normal    Step Over Obstacle Mild Impairment    Step Around Obstacles Mild Impairment    Steps Normal    Total Score 22                         [facility] Adult PT Treatment/Exercise - 07/14/20 0001      Ambulation/Gait   Ambulation/Gait Yes    Ambulation/Gait Assistance 6: Modified independent (Device/Increase time)    Ambulation/Gait Assistance Details through clinic to assess balance    Stairs Yes    Stairs Assistance 5: Supervision    Stair Management Technique One rail Right;One rail Left    Number of Stairs 24    Height of  Stairs 6               Balance Exercises - 07/14/20 0001      Balance Exercises: Standing   Other Standing Exercises shoulder abduction at wall with contralateral hip abduction, 10x ea., in // bars reaching to ea. side, 3s hold x5, repeated with EC and again in tandem stance both positions               PT Short Term Goals - 06/19/20 1525      PT SHORT TERM GOAL #1   Title Pt will increase gait velocity to >/= 2.9 ft/sec (or 0.9 m/sec)    Baseline .76 m/sec (2.5 ft/sec); 2.3 ft/sec 05/03/20; 06/05/20 Deferred due to R hip pain. 06/19/20 0.16m/s    Time 4    Period Weeks    Status Achieved    Target Date 06/23/20      PT SHORT TERM GOAL #2   Title Pt will ambulate >34' with SPC on varied surfaces mod I for improved community mobility.    Baseline 06/05/20 Ambulation restricted to indoor in gym area using [facility],, deferred outside ambulaiton due to possibility  of exacerbating hip pain. 06/19/20  1000' with SPC on varied surfaces mod I/supervision.    Time 4    Period Weeks    Status Partially Met    Target Date 06/23/20             PT Long Term Goals - 07/14/20 1239      PT LONG TERM GOAL #1   Title Pt will be independent with final HEP and will report full return to home yoga (All LTGs 07/23/20)    Baseline 07/14/20 Still not returned to yoga but conseidering chair yoga to graduate into    Time 8    Period Weeks    Status Partially Met      PT LONG TERM GOAL #2   Title Pt will report improvement in LE FOTO to >/= 74%    Baseline 62%    Time 8    Period Weeks    Status New      PT LONG TERM GOAL #3   Title Pt will improve 30 second sit to stand to >/= 14 reps to indicate improved functional LE strength    Baseline 07/14/20 12 STS in 30s    Time 8    Period Weeks    Status Partially Met      PT LONG TERM GOAL #4   Title Pt will improve DGI to >/= 20/24 to indicate decreased falls risk    Baseline 07/14/20 DGI score 22, goal met    Time 8    Period Weeks    Status Achieved      PT LONG TERM GOAL #5   Title Pt will improve gait velocity without cane to >/= 3.2 ft/sec (or 1 m/sec)    Baseline 07/14/20 gait speed 0.90 m/s    Time 8    Period Weeks    Status New      PT LONG TERM GOAL #6   Title Pt will demonstrate ability to negotiate 2 flights of stairs (24) with 1 or no rail, alternating sequence, MOD I    Baseline Able to negotiate 24 steps with cane, step through, 12 steps R rail, 12 steps L rail under S    Time 8    Period Weeks    Status Partially Met      PT LONG TERM GOAL #  7   Title Pt will initiate return to driving with supervised driving short distances near her home.    Baseline 07/14/20 Has returned to some short distance driving    Time 8    Period Weeks    Status New                 Plan - 07/14/20 1319    Clinical Impression Statement Todays session consisted of assessment of progress towards  goals, review of HEP and continued high level balance tasking, improved performance on balance tasks but deficits and LOB evident when balancing to R, several goals met today w/o LOB noted during sesion.  Patient continues to perform well with dual tasking and combined UE/LE dissociation activities.    Personal Factors and Comorbidities Comorbidity 3+    Comorbidities depression, dizziness/vertigo, osteoporosis    Examination-Activity Limitations Carry;Locomotion Level;Stairs    Examination-Participation Restrictions Community Activity;Driving;Volunteer    Stability/Clinical Decision Making Stable/Uncomplicated    Rehab Potential Good    PT Frequency 2x / week    PT Duration 8 weeks    PT Treatment/Interventions ADLs/Self Care Home Management;Aquatic Therapy;Canalith Repostioning;Electrical Stimulation;DME Instruction;Gait training;Stair training;Functional mobility training;Therapeutic activities;Therapeutic exercise;Balance training;Neuromuscular re-education;Patient/family education;Orthotic Fit/Training;Manual techniques;Passive range of motion;Dry needling;Moist Heat;Cryotherapy;Vestibular    PT Next Visit Plan Continue to work more on stepping strategy. Functional lateral hip strengthening, gait on varied surfaces with and without cane. SLS activities.  Work on balance on compliant surfaces and gait with horizontal head turns.  Incorporate UE/LE dissociation skills to challenge balance.    PT Home Exercise Plan Access Code: [email or web address]    Consulted and Agree with Plan of Care Patient           Patient will benefit from skilled therapeutic intervention in order to improve the following deficits and impairments:  Abnormal gait,Decreased balance,Decreased strength,Difficulty walking,Pain  Visit Diagnosis: Other abnormalities of gait and mobility  Unsteadiness on feet  Muscle weakness (generalized)  Pain in right hip     Problem List Patient Active Problem List   Diagnosis Date Noted   . Cerebral thrombosis with cerebral infarction 02/11/2020  . Acute focal neurologic deficit with partial resolution 02/10/2020  . Depression 02/10/2020  . Osteoporosis 02/10/2020    Lanice Shirts PT 07/14/2020, 1:47 PM  [facility] [address] [place], [place], [address] Phone: [phone]   Fax:  [phone]  Name: TIMERA WINDT MRN: [id number] Date of Birth: 1950/04/25

## 2020-07-17 ENCOUNTER — Other Ambulatory Visit: Payer: Self-pay

## 2020-07-17 ENCOUNTER — Ambulatory Visit: Payer: Medicare Other

## 2020-07-17 DIAGNOSIS — M6281 Muscle weakness (generalized): Secondary | ICD-10-CM

## 2020-07-17 DIAGNOSIS — R2689 Other abnormalities of gait and mobility: Secondary | ICD-10-CM | POA: Diagnosis not present

## 2020-07-17 DIAGNOSIS — R2681 Unsteadiness on feet: Secondary | ICD-10-CM

## 2020-07-17 DIAGNOSIS — M25551 Pain in right hip: Secondary | ICD-10-CM | POA: Diagnosis not present

## 2020-07-17 NOTE — Therapy (Signed)
[facility] [address] [place], [place], [address] Phone: [phone]   Fax:  [phone]  Physical Therapy Treatment  Patient Details  Name: Melanie Chen MRN: [id number] Date of Birth: 1950-04-01 Referring Provider (PT): Penni Bombard, MD   Encounter Date: 07/17/2020   PT End of Session - 07/17/20 1528    Visit Number 21    Number of Visits 23    Date for PT Re-Evaluation 07/23/20    Authorization Type Federal BCBS and Medicare; VL PT/OT/ST: 75; 10th visit PN    Progress Note Due on Visit 20    PT Start Time 1445    PT Stop Time 1530    PT Time Calculation (min) 45 min    Equipment Utilized During Treatment Gait belt    Activity Tolerance Patient tolerated treatment well    Behavior During Therapy WFL for tasks assessed/performed           Past Medical History:  Diagnosis Date  . Colon polyp   . Depression   . Dizziness 09/10/14  . Hemorrhoids    pt unsure  . Osteoporosis     Past Surgical History:  Procedure Laterality Date  . COLONOSCOPY  09/2000  . COLONOSCOPY WITH PROPOFOL N/A 09/22/2019   Procedure: COLONOSCOPY WITH PROPOFOL;  Surgeon: Clarene Essex, MD;  Location: [facility];  Service: Endoscopy;  Laterality: N/A;  . HOT HEMOSTASIS N/A 09/22/2019   Procedure: HOT HEMOSTASIS (ARGON PLASMA COAGULATION/BICAP);  Surgeon: Clarene Essex, MD;  Location: [facility];  Service: Endoscopy;  Laterality: N/A;  . POLYPECTOMY  09/22/2019   Procedure: POLYPECTOMY;  Surgeon: Clarene Essex, MD;  Location: [facility];  Service: Endoscopy;;  . TUBAL LIGATION  1983    There were no vitals filed for this visit.   Subjective Assessment - 07/17/20 1452    Subjective Has yet to hear about her hip x-ray results.                07/17/20 0001  Ambulation/Gait  Ambulation/Gait Yes  Ambulation Distance (Feet) 230 Feet  Assistive device Straight cane  Gait Pattern Step-through pattern  Lumbar Exercises:  Seated  Other Seated Lumbar Exercises core exercises of hip tosses, latissimus pushdowns, shoulder tosses, chops and Victories, 2x10 with 1.1# ball  Knee/Hip Exercises: Aerobic  Nustep L2 8' arms 10                       Balance Exercises - 07/18/20 0001      Balance Exercises: Standing   Other Standing Exercises trust falls against wall requiring verbal and tactile cues as well as extra time and trials to demo proper form    Other Standing Exercises Comments contralateral hip shoulder abduction while pressing on bolster, 2x10               PT Short Term Goals - 07/14/20 1431      PT SHORT TERM GOAL #1   Title Pt will increase gait velocity to >/= 2.9 ft/sec (or 0.9 m/sec)    Baseline .76 m/sec (2.5 ft/sec); 2.3 ft/sec 05/03/20; 06/05/20 Deferred due to R hip pain. 06/19/20 0.25ms    Time 4    Period Weeks    Status Achieved    Target Date 06/23/20      PT SHORT TERM GOAL #2   Title Pt will ambulate >>55 with SPC on varied surfaces mod I for improved community mobility.    Baseline 06/05/20 Ambulation restricted to  indoor in gym area using SPC,, deferred outside ambulaiton due to possibility of exacerbating hip pain. 06/19/20  1000' with SPC on varied surfaces mod I/supervision.    Time 4    Period Weeks    Status Partially Met    Target Date 06/23/20      PT SHORT TERM GOAL #3   Title Pt will improve DGI by 4 points to indicate decreased falls risk    Baseline 13/24; 18/24 05/03/20    Time 4    Period Weeks    Status Achieved    Target Date 05/05/20      PT SHORT TERM GOAL #4   Title Pt will increase gait velocity to >/= 2.9 ft/sec (or 0.9 m/sec)    Baseline .76 m/sec (2.5 ft/sec); 2.3 ft/sec 05/03/20    Status Achieved    Target Date 05/05/20      PT SHORT TERM GOAL #5   Title Pt will negotiate 12 stairs with one rail, alternating sequence with supervision    Baseline pt has basement garage-climbs 12 to main level + 12 to upstairs bedroom    Time 4     Period Weeks    Status Achieved    Target Date 05/05/20             PT Long Term Goals - 07/14/20 1239      PT LONG TERM GOAL #1   Title Pt will be independent with final HEP and will report full return to home yoga (All LTGs 07/23/20)    Baseline 07/14/20 Still not returned to yoga but conseidering chair yoga to graduate into    Time 8    Period Weeks    Status Partially Met      PT LONG TERM GOAL #2   Title Pt will report improvement in LE FOTO to >/= 74%    Baseline 62%    Time 8    Period Weeks    Status New      PT LONG TERM GOAL #3   Title Pt will improve 30 second sit to stand to >/= 14 reps to indicate improved functional LE strength    Baseline 07/14/20 12 STS in 30s    Time 8    Period Weeks    Status Partially Met      PT LONG TERM GOAL #4   Title Pt will improve DGI to >/= 20/24 to indicate decreased falls risk    Baseline 07/14/20 DGI score 22, goal met    Time 8    Period Weeks    Status Achieved      PT LONG TERM GOAL #5   Title Pt will improve gait velocity without cane to >/= 3.2 ft/sec (or 1 m/sec)    Baseline 07/14/20 gait speed 0.90 m/s    Time 8    Period Weeks    Status New      PT LONG TERM GOAL #6   Title Pt will demonstrate ability to negotiate 2 flights of stairs (24) with 1 or no rail, alternating sequence, MOD I    Baseline 07/14/20 Able to negotiate 24 steps with cane, step through, 12 steps R rail, 12 steps L rail under S    Time 8    Period Weeks    Status Partially Met      PT LONG TERM GOAL #7   Title Pt will initiate return to driving with supervised driving short distances near her home.    Baseline 07/14/20   Has returned to some short distance driving    Time 8    Period Weeks    Status New                 Plan - 07/17/20 1505    Clinical Impression Statement Todaya session focused on core strengthening leading into balance tasks and activities, began with seated core activation strategies and advanced into standing  balance tasks, continues to require verbal and tactile cuing for proper exercise technique as well as detailed explanation of task directions    Personal Factors and Comorbidities Comorbidity 3+    Comorbidities depression, dizziness/vertigo, osteoporosis    Examination-Activity Limitations Carry;Locomotion Level;Stairs    Examination-Participation Restrictions Community Activity;Driving;Volunteer    Stability/Clinical Decision Making Stable/Uncomplicated    Rehab Potential Good    PT Frequency 2x / week    PT Duration 8 weeks    PT Treatment/Interventions ADLs/Self Care Home Management;Aquatic Therapy;Canalith Repostioning;Electrical Stimulation;DME Instruction;Gait training;Stair training;Functional mobility training;Therapeutic activities;Therapeutic exercise;Balance training;Neuromuscular re-education;Patient/family education;Orthotic Fit/Training;Manual techniques;Passive range of motion;Dry needling;Moist Heat;Cryotherapy;Vestibular    PT Next Visit Plan Continue to work more on stepping strategy. Functional lateral hip strengthening, gait on varied surfaces with and without cane. SLS activities.  Work on balance on compliant surfaces and gait with horizontal head turns.  Continue UE/LE dissociation skills to challenge balance while incorporating core stabilization as appropriate/beneficial    PT Home Exercise Plan Access Code: [email or web address]    Consulted and Agree with Plan of Care Patient           Patient will benefit from skilled therapeutic intervention in order to improve the following deficits and impairments:  Abnormal gait,Decreased balance,Decreased strength,Difficulty walking,Pain  Visit Diagnosis: Other abnormalities of gait and mobility  Unsteadiness on feet  Muscle weakness (generalized)     Problem List Patient Active Problem List   Diagnosis Date Noted  . Cerebral thrombosis with cerebral infarction 02/11/2020  . Acute focal neurologic deficit with partial resolution  02/10/2020  . Depression 02/10/2020  . Osteoporosis 02/10/2020    Jeffrey M Ziemba 07/18/2020, 8:00 PM  [facility] [address] [place], [place], [address] Phone: [phone]   Fax:  [phone]  Name: Talaya L Coulibaly MRN: [id number] Date of Birth: 10/15/1950   

## 2020-07-19 ENCOUNTER — Ambulatory Visit: Payer: Medicare Other

## 2020-07-19 ENCOUNTER — Other Ambulatory Visit: Payer: Self-pay

## 2020-07-19 DIAGNOSIS — R2689 Other abnormalities of gait and mobility: Secondary | ICD-10-CM

## 2020-07-19 DIAGNOSIS — M6281 Muscle weakness (generalized): Secondary | ICD-10-CM

## 2020-07-19 DIAGNOSIS — M25551 Pain in right hip: Secondary | ICD-10-CM | POA: Diagnosis not present

## 2020-07-19 DIAGNOSIS — R2681 Unsteadiness on feet: Secondary | ICD-10-CM | POA: Diagnosis not present

## 2020-07-19 NOTE — Therapy (Signed)
[facility] [address] [place], [place], [address] Phone: [phone]   Fax:  [phone]  Physical Therapy Treatment/Recertification  Patient Details  Name: Melanie Chen MRN: [id number] Date of Birth: [birth year] Referring Provider (PT): Penni Bombard, MD   Encounter Date: 07/19/2020   PT End of Session - 07/19/20 1108    Visit Number 22    Number of Visits 30    Date for PT Re-Evaluation 22/63/33   10 week cert, 8 week poc due to pt being out of town some   [place] and Medicare; VL PT/OT/ST: 75; 10th visit PN    Progress Note Due on Visit 20    PT Start Time 1106    PT Stop Time 1142    PT Time Calculation (min) 36 min    Equipment Utilized During Treatment Gait belt    Activity Tolerance Patient tolerated treatment well    Behavior During Therapy WFL for tasks assessed/performed           Past Medical History:  Diagnosis Date  . Colon polyp   . Depression   . Dizziness 09/10/14  . Hemorrhoids    pt unsure  . Osteoporosis     Past Surgical History:  Procedure Laterality Date  . COLONOSCOPY  09/2000  . COLONOSCOPY WITH PROPOFOL N/A 09/22/2019   Procedure: COLONOSCOPY WITH PROPOFOL;  Surgeon: Clarene Essex, MD;  Location: [facility];  Service: Endoscopy;  Laterality: N/A;  . HOT HEMOSTASIS N/A 09/22/2019   Procedure: HOT HEMOSTASIS (ARGON PLASMA COAGULATION/BICAP);  Surgeon: Clarene Essex, MD;  Location: [facility];  Service: Endoscopy;  Laterality: N/A;  . POLYPECTOMY  09/22/2019   Procedure: POLYPECTOMY;  Surgeon: Clarene Essex, MD;  Location: [facility];  Service: Endoscopy;;  . TUBAL LIGATION  1983    There were no vitals filed for this visit.   Subjective Assessment - 07/19/20 1109    Subjective Pt reports that she would like to continue her PT. She has not gotten results of her hip x-ray.              [facility] PT Assessment - 07/19/20 1114      Assessment    Medical Diagnosis L ACA CVA    Referring Provider (PT) Penni Bombard, MD    Onset Date/Surgical Date 03/20/20      Observation/Other Assessments   Focus on Therapeutic Outcomes (FOTO)  61      Functional Gait  Assessment   Gait assessed  Yes    Gait Level Surface Walks 20 ft in less than 5.5 sec, no assistive devices, good speed, no evidence for imbalance, normal gait pattern, deviates no more than 6 in outside of the 12 in walkway width.    Change in Gait Speed Able to smoothly change walking speed without loss of balance or gait deviation. Deviate no more than 6 in outside of the 12 in walkway width.    Gait with Horizontal Head Turns Performs head turns with moderate changes in gait velocity, slows down, deviates 10-15 in outside 12 in walkway width but recovers, can continue to walk.    Gait with Vertical Head Turns Performs task with slight change in gait velocity (eg, minor disruption to smooth gait path), deviates 6 - 10 in outside 12 in walkway width or uses assistive device    Gait and Pivot Turn Pivot turns safely in greater than 3 sec and stops with no loss of balance, or pivot turns  safely within 3 sec and stops with mild imbalance, requires small steps to catch balance.    Step Over Obstacle Is able to step over one shoe box (4.5 in total height) without changing gait speed. No evidence of imbalance.    Gait with Narrow Base of Support Ambulates less than 4 steps heel to toe or cannot perform without assistance.    Gait with Eyes Closed Cannot walk 20 ft without assistance, severe gait deviations or imbalance, deviates greater than 15 in outside 12 in walkway width or will not attempt task.    Ambulating Backwards Walks 20 ft, slow speed, abnormal gait pattern, evidence for imbalance, deviates 10-15 in outside 12 in walkway width.    Steps Alternating feet, must use rail.    Total Score 16                         [facility] Adult PT Treatment/Exercise - 07/19/20  1114      Ambulation/Gait   Ambulation/Gait Yes    Ambulation/Gait Assistance 5: Supervision    Ambulation/Gait Assistance Details around in [place] between activities    Assistive device None    Gait Pattern Step-through pattern      Neuro Re-ed    Neuro Re-ed Details  In // bars: standing on rockerboard positioned ant/post maintaining level x 30 sec, rocking front/back x 10, eyes closed x 30 sec and then head nods x 10. Turned board to lateral and repeated except performed horizontal head turns x 10. CGA for safety with activities. Pt did have tendency to lose balance with horizontal head turns to opposite side.                  PT Education - 07/19/20 2042    Education Details Discussed results of testing and recert plan.    Person(s) Educated Patient    Methods Explanation    Comprehension Verbalized understanding            PT Short Term Goals - 07/14/20 1431      PT SHORT TERM GOAL #1   Title Pt will increase gait velocity to >/= 2.9 ft/sec (or 0.9 m/sec)    Baseline .76 m/sec (2.5 ft/sec); 2.3 ft/sec 05/03/20; 06/05/20 Deferred due to R hip pain. 06/19/20 0.46m/s    Time 4    Period Weeks    Status Achieved    Target Date 06/23/20      PT SHORT TERM GOAL #2   Title Pt will ambulate >24' with SPC on varied surfaces mod I for improved community mobility.    Baseline 06/05/20 Ambulation restricted to indoor in gym area using [facility],, deferred outside ambulaiton due to possibility of exacerbating hip pain. 06/19/20  1000' with SPC on varied surfaces mod I/supervision.    Time 4    Period Weeks    Status Partially Met    Target Date 06/23/20      PT SHORT TERM GOAL #3   Title Pt will improve DGI by 4 points to indicate decreased falls risk    Baseline 13/24; 18/24 05/03/20    Time 4    Period Weeks    Status Achieved    Target Date 05/05/20      PT SHORT TERM GOAL #4   Title Pt will increase gait velocity to >/= 2.9 ft/sec (or 0.9 m/sec)    Baseline .76 m/sec (2.5  ft/sec); 2.3 ft/sec 05/03/20    Status Achieved    Target  Date 05/05/20      PT SHORT TERM GOAL #5   Title Pt will negotiate 12 stairs with one rail, alternating sequence with supervision    Baseline pt has basement garage-climbs 12 to main level + 12 to upstairs bedroom    Time 4    Period Weeks    Status Achieved    Target Date 05/05/20             PT Long Term Goals - 07/19/20 1112      PT LONG TERM GOAL #1   Title Pt will be independent with final HEP and will report full return to home yoga (All LTGs 07/23/20)    Baseline 07/14/20 Still not returned to yoga but conseidering chair yoga to graduate into    Time 8    Period Weeks    Status Partially Met      PT LONG TERM GOAL #2   Title Pt will report improvement in LE FOTO to >/= 74%    Baseline 62%    Time 8    Period Weeks    Status New      PT LONG TERM GOAL #3   Title Pt will improve 30 second sit to stand to >/= 14 reps to indicate improved functional LE strength    Baseline 07/14/20 12 STS in 30s    Time 8    Period Weeks    Status Not Met      PT LONG TERM GOAL #4   Title Pt will improve DGI to >/= 20/24 to indicate decreased falls risk    Baseline 07/14/20 DGI score 22, goal met    Time 8    Period Weeks    Status Achieved      PT LONG TERM GOAL #5   Title Pt will improve gait velocity without cane to >/= 3.2 ft/sec (or 1 m/sec)    Baseline 07/14/20 gait speed 0.90 m/s    Time 8    Period Weeks    Status Not Met      PT LONG TERM GOAL #6   Title Pt will demonstrate ability to negotiate 2 flights of stairs (24) with 1 or no rail, alternating sequence, MOD I    Baseline 07/14/20 Able to negotiate 24 steps with cane, step through, 12 steps R rail, 12 steps L rail under S    Time 8    Period Weeks    Status Partially Met      PT LONG TERM GOAL #7   Title Pt will initiate return to driving with supervised driving short distances near her home.    Baseline 07/19/20  Pt has returned to driving around town.     Time 8    Period Weeks    Status Achieved          Updated goals:  PT Short Term Goals - 07/19/20 2114      PT SHORT TERM GOAL #1   Title Pt will increase 30 sec sit to stand to 14 or more for improved functional strength.    Baseline 12 reps currently    Time 4    Period Weeks    Status New      PT SHORT TERM GOAL #2   Title Pt will increase FGA from 16 to >19 for improved balance and gait safety.    Baseline 07/19/20 16/30    Time 4    Period Weeks    Status New    Target  Date 08/18/20           PT Long Term Goals - 07/19/20 2116      PT LONG TERM GOAL #1   Title Pt will be independent with final HEP and will report full return to home yoga (All LTGs 09/17/20)    Baseline 07/14/20 Still not returned to yoga but conseidering chair yoga to graduate into. PT continues to add to.    Time 8    Period Weeks    Status On-going    Target Date 09/17/20      PT LONG TERM GOAL #2   Title Pt will report improvement in LE FOTO to >/= 74%    Baseline 62%    Time 8    Period Weeks    Status On-going    Target Date 09/17/20      PT LONG TERM GOAL #3   Title Pt will ambulate >1000' on varied surfaces with SPC mod I.    Time 8    Period Weeks    Status New    Target Date 09/17/20      PT LONG TERM GOAL #4   Title Pt will increase FGA to >22/30 for improved balance and gait safety.    Baseline 07/19/20 16/30    Time 8    Period Weeks    Status New    Target Date 09/17/20      PT LONG TERM GOAL #5   Title Pt will improve gait velocity without cane to >/= 3.2 ft/sec (or 1 m/sec)    Baseline 07/14/20 gait speed 0.90 m/s    Time 8    Period Weeks    Status On-going    Target Date 09/17/20                 Plan - 07/19/20 2047    Clinical Impression Statement Pt has shown good progress towards goals. She met DGI goal showing improving balance. PT performed FGA today with pt scoring 16/30 which indicates she is still a fall risk. She has returned to driving. Pt is  ambulating with SPC mod I on level surfaces and supervision on nonlevel. Have also started gait training without a device. Pt improved on 30 sec sit to stand just not to goal showing her functional strength is improving. Pt will continue to benefit from skilled PT to continue to progress her strength, balance and functional mobility.    Personal Factors and Comorbidities Comorbidity 3+    Comorbidities depression, dizziness/vertigo, osteoporosis    Examination-Activity Limitations Carry;Locomotion Level;Stairs    Examination-Participation Restrictions Community Activity;Driving;Volunteer    Stability/Clinical Decision Making Stable/Uncomplicated    Rehab Potential Good    PT Frequency 1x / week    PT Duration 8 weeks    PT Treatment/Interventions ADLs/Self Care Home Management;Aquatic Therapy;Canalith Repostioning;Electrical Stimulation;DME Instruction;Gait training;Stair training;Functional mobility training;Therapeutic activities;Therapeutic exercise;Balance training;Neuromuscular re-education;Patient/family education;Orthotic Fit/Training;Manual techniques;Passive range of motion;Dry needling;Moist Heat;Cryotherapy;Vestibular    PT Next Visit Plan Continue to work more on stepping strategy. Functional lateral hip strengthening, gait on varied surfaces with and without cane. SLS activities.  Work on balance on compliant surfaces and gait with horizontal head turns.  Add in more core stabilization with activities.    PT Home Exercise Plan Access Code: [email or web address]    Consulted and Agree with Plan of Care Patient           Patient will benefit from skilled therapeutic intervention in order to improve the following deficits and impairments:  Abnormal gait,Decreased balance,Decreased strength,Difficulty walking,Pain  Visit Diagnosis: Other abnormalities of gait and mobility  Muscle weakness (generalized)  Unsteadiness on feet     Problem List Patient Active Problem List   Diagnosis Date  Noted  . Cerebral thrombosis with cerebral infarction 02/11/2020  . Acute focal neurologic deficit with partial resolution 02/10/2020  . Depression 02/10/2020  . Osteoporosis 02/10/2020    Electa Sniff, PT, DPT, NCS 07/19/2020, 9:13 PM  [facility] [address] [place], [place], [address] Phone: [phone]   Fax:  [phone]  Name: Melanie Chen MRN: [id number] Date of Birth: [birth year]

## 2020-07-21 ENCOUNTER — Ambulatory Visit: Payer: Federal, State, Local not specified - PPO

## 2020-07-26 ENCOUNTER — Ambulatory Visit: Payer: Medicare Other

## 2020-08-02 ENCOUNTER — Ambulatory Visit: Payer: Medicare Other

## 2020-08-16 ENCOUNTER — Ambulatory Visit: Payer: Medicare Other | Admitting: Physical Therapy

## 2020-08-17 DIAGNOSIS — M1611 Unilateral primary osteoarthritis, right hip: Secondary | ICD-10-CM | POA: Diagnosis not present

## 2020-08-22 ENCOUNTER — Encounter: Payer: Self-pay | Admitting: Physical Therapy

## 2020-08-22 ENCOUNTER — Ambulatory Visit: Payer: Medicare Other | Attending: Diagnostic Neuroimaging | Admitting: Physical Therapy

## 2020-08-22 ENCOUNTER — Other Ambulatory Visit: Payer: Self-pay

## 2020-08-22 DIAGNOSIS — R2681 Unsteadiness on feet: Secondary | ICD-10-CM | POA: Insufficient documentation

## 2020-08-22 DIAGNOSIS — R2689 Other abnormalities of gait and mobility: Secondary | ICD-10-CM | POA: Diagnosis not present

## 2020-08-22 DIAGNOSIS — M25551 Pain in right hip: Secondary | ICD-10-CM | POA: Diagnosis not present

## 2020-08-22 DIAGNOSIS — M6281 Muscle weakness (generalized): Secondary | ICD-10-CM | POA: Insufficient documentation

## 2020-08-23 ENCOUNTER — Ambulatory Visit: Payer: Medicare Other

## 2020-08-23 NOTE — Therapy (Addendum)
[facility] [address] [place], [place], [address] Phone: [phone]   Fax:  [phone]  Physical Therapy Treatment/DC Summary  Patient Details  Name: Melanie Chen MRN: [id number] Date of Birth: 06-26-1950 Referring Provider (PT): Penni Bombard, MD   Encounter Date: 08/22/2020  PHYSICAL THERAPY DISCHARGE SUMMARY  Visits from Start of Care: 23  Current functional level related to goals / functional outcomes: Goals partially met, further treatment on hold pending R THA    Remaining deficits: Strength and pain R hip   Education / Equipment: HEP   Patient agrees to discharge. Patient goals were partially met. Patient is being discharged due to a change in medical status. Patient to undergo R THA.   PT End of Session - 08/22/20 1323     Visit Number 23    Number of Visits 30    Date for PT Re-Evaluation 60/63/01   10 week cert, 8 week poc due to pt being out of town some   [place] and Medicare; VL PT/OT/ST: 75; 10th visit PN    Progress Note Due on Visit 30    PT Start Time 1317    PT Stop Time 1355    PT Time Calculation (min) 38 min    Equipment Utilized During Treatment Gait belt    Activity Tolerance Patient tolerated treatment well    Behavior During Therapy WFL for tasks assessed/performed             Past Medical History:  Diagnosis Date   Colon polyp    Depression    Dizziness 09/10/14   Hemorrhoids    pt unsure   Osteoporosis     Past Surgical History:  Procedure Laterality Date   COLONOSCOPY  09/2000   COLONOSCOPY WITH PROPOFOL N/A 09/22/2019   Procedure: COLONOSCOPY WITH PROPOFOL;  Surgeon: Clarene Essex, MD;  Location: [facility];  Service: Endoscopy;  Laterality: N/A;   HOT HEMOSTASIS N/A 09/22/2019   Procedure: HOT HEMOSTASIS (ARGON PLASMA COAGULATION/BICAP);  Surgeon: Clarene Essex, MD;  Location: [facility];  Service: Endoscopy;  Laterality: N/A;    POLYPECTOMY  09/22/2019   Procedure: POLYPECTOMY;  Surgeon: Clarene Essex, MD;  Location: [facility];  Service: Endoscopy;;   TUBAL LIGATION  1983    There were no vitals filed for this visit.   Subjective Assessment - 08/22/20 1319     Subjective Pt is to have THA on right hip, calling to schedule later this week with Dr. Mayer Camel. No falls.    Pertinent History colon polyp, depression, dizziness, osteoporosis    Limitations Walking    Patient Stated Goals Higher energy level; return to driving, RLE to be stronger    Currently in Pain? Yes    Pain Score 4     Pain Location Hip    Pain Orientation Right    Pain Descriptors / Indicators Sore    Pain Type Chronic pain    Pain Onset More than a month ago    Pain Frequency Intermittent    Aggravating Factors  abduction, exiting passenger side of car    Pain Relieving Factors tylenol                [facility] PT Assessment - 08/22/20 1324       Functional Gait  Assessment   Gait assessed  Yes    Gait Level Surface Walks 20 ft in less than 7 sec but greater than 5.5 sec, uses assistive device, slower  speed, mild gait deviations, or deviates 6-10 in outside of the 12 in walkway width.   7.28 sec 's   Change in Gait Speed Able to smoothly change walking speed without loss of balance or gait deviation. Deviate no more than 6 in outside of the 12 in walkway width.    Gait with Horizontal Head Turns Performs head turns smoothly with slight change in gait velocity (eg, minor disruption to smooth gait path), deviates 6-10 in outside 12 in walkway width, or uses an assistive device.    Gait with Vertical Head Turns Performs head turns with no change in gait. Deviates no more than 6 in outside 12 in walkway width.    Gait and Pivot Turn Pivot turns safely within 3 sec and stops quickly with no loss of balance.    Step Over Obstacle Is able to step over one shoe box (4.5 in total height) without changing gait speed. No evidence of imbalance.    Gait  with Narrow Base of Support Ambulates less than 4 steps heel to toe or cannot perform without assistance.    Gait with Eyes Closed Walks 20 ft, slow speed, abnormal gait pattern, evidence for imbalance, deviates 10-15 in outside 12 in walkway width. Requires more than 9 sec to ambulate 20 ft.   14.72 sec's   Ambulating Backwards Walks 20 ft, uses assistive device, slower speed, mild gait deviations, deviates 6-10 in outside 12 in walkway width.   28.06 sec's no imbalance   Steps Alternating feet, must use rail.    Total Score 20    FGA comment: 20/30= medium risk for falls                    [facility] Adult PT Treatment/Exercise - 08/22/20 1324       Transfers   Transfers Sit to Stand;Stand to Sit    Sit to Stand 5: Supervision    Five time sit to stand comments  14.03 sec's no UE support, using standard height chair    Stand to Sit 5: Supervision      Ambulation/Gait   Ambulation/Gait Yes    Ambulation/Gait Assistance 5: Supervision;4: Min guard;4: Min assist    Ambulation/Gait Assistance Details no device in clinic with supervision. use of straight cane outdoors with min guard to min assist, several episodes of left lateral balance loss with min assist to correct needed x2.    Ambulation Distance (Feet) 1000 Feet    Assistive device None;Straight cane    Gait Pattern Step-through pattern    Ambulation Surface Level;Indoor    Gait velocity 15.40 sec's= 0.65 m/s with cane     Exercises   Exercises Other Exercises    Other Exercises  discussed HEP. to hold on side stepping, marching and any other ex that causes pain. to continue with corner balance ex; administered FOTO with FS Primary Measure 63 (was 58).                 PT Short Term Goals - 07/19/20 2114       PT SHORT TERM GOAL #1   Title Pt will increase 30 sec sit to stand to 14 or more for improved functional strength.    Baseline 12 reps currently    Time 4    Period Weeks    Status New      PT SHORT  TERM GOAL #2   Title Pt will increase FGA from 16 to >19 for improved balance and gait  safety.    Baseline 07/19/20 16/30    Time 4    Period Weeks    Status New    Target Date 08/18/20               PT Long Term Goals - 08/23/20 1011       PT LONG TERM GOAL #1   Title Pt will be independent with final HEP and will report full return to home yoga (All LTGs 09/17/20)    Baseline 08/22/20: pt to undergo THA. will continue to work on ex's on HEP that do not excerbate her hip pain. Has not returned to Yoga.    Status Partially Met      PT LONG TERM GOAL #2   Title Pt will report improvement in LE FOTO to >/= 74%    Baseline 08/22/20: 63%, 1% improvement from last session    Status Partially Met      PT LONG TERM GOAL #3   Title Pt will ambulate >1000' on varied surfaces with SPC mod I.    Baseline 08/22/20:continues to need min guard to min assist on outdoor surfaces with cane    Status Not Met      PT LONG TERM GOAL #4   Title Pt will increase FGA to >22/30 for improved balance and gait safety.    Baseline 08/22/20: 20/30 scored today, improved from 16/30 just not to goal level    Status Partially Met      PT LONG TERM GOAL #5   Title Pt will improve gait velocity without cane to >/= 3.2 ft/sec (or 1 m/sec)    Baseline 08/22/20: 0.65 m/s with cane, decreased from 0.90 m/s at last check    Status Not Met                   Plan - 08/22/20 1323     Clinical Impression Statement Today's skilled session focused on progress toward LTGs due to pt having THA soon. Pt did not meet most goals and still presents with balance deficits. Continues to need up to min assist with gait on uneven surfaces, scored 20/30 on the FGA and had a decrease in gait speed to 0.65 m/s (was 0.90 m/s at last assessment).    Personal Factors and Comorbidities Comorbidity 3+    Comorbidities depression, dizziness/vertigo, osteoporosis    Examination-Activity Limitations Carry;Locomotion Level;Stairs     Examination-Participation Restrictions Community Activity;Driving;Volunteer    Stability/Clinical Decision Making Stable/Uncomplicated    Rehab Potential Good    PT Frequency 1x / week    PT Duration 8 weeks    PT Treatment/Interventions ADLs/Self Care Home Management;Aquatic Therapy;Canalith Repostioning;Electrical Stimulation;DME Instruction;Gait training;Stair training;Functional mobility training;Therapeutic activities;Therapeutic exercise;Balance training;Neuromuscular re-education;Patient/family education;Orthotic Fit/Training;Manual techniques;Passive range of motion;Dry needling;Moist Heat;Cryotherapy;Vestibular    PT Next Visit Plan discharge due to pt having THA soon    PT Home Exercise Plan Access Code: [email or web address]    Consulted and Agree with Plan of Care Patient             Patient will benefit from skilled therapeutic intervention in order to improve the following deficits and impairments:  Abnormal gait, Decreased balance, Decreased strength, Difficulty walking, Pain  Visit Diagnosis: Other abnormalities of gait and mobility  Muscle weakness (generalized)  Unsteadiness on feet  Pain in right hip     Problem List Patient Active Problem List   Diagnosis Date Noted   Cerebral thrombosis with cerebral infarction 02/11/2020   Acute focal neurologic deficit with  partial resolution 02/10/2020   Depression 02/10/2020   Osteoporosis 02/10/2020    Willow Ora, PTA, [facility] [address], [place], [place] [address] [phone] 08/23/20, 12:25 PM   Name: CATERIN TABARES MRN: [id number] Date of Birth: November 13, 1950

## 2020-08-24 ENCOUNTER — Other Ambulatory Visit: Payer: Self-pay | Admitting: Diagnostic Neuroimaging

## 2020-08-30 ENCOUNTER — Ambulatory Visit: Payer: Federal, State, Local not specified - PPO

## 2020-09-04 DIAGNOSIS — H353132 Nonexudative age-related macular degeneration, bilateral, intermediate dry stage: Secondary | ICD-10-CM | POA: Diagnosis not present

## 2020-09-04 DIAGNOSIS — Z961 Presence of intraocular lens: Secondary | ICD-10-CM | POA: Diagnosis not present

## 2020-09-04 DIAGNOSIS — H40013 Open angle with borderline findings, low risk, bilateral: Secondary | ICD-10-CM | POA: Diagnosis not present

## 2020-09-04 DIAGNOSIS — H43812 Vitreous degeneration, left eye: Secondary | ICD-10-CM | POA: Diagnosis not present

## 2020-09-04 DIAGNOSIS — H524 Presbyopia: Secondary | ICD-10-CM | POA: Diagnosis not present

## 2020-09-06 ENCOUNTER — Ambulatory Visit: Payer: Federal, State, Local not specified - PPO

## 2020-09-13 ENCOUNTER — Ambulatory Visit: Payer: Federal, State, Local not specified - PPO

## 2020-09-19 DIAGNOSIS — R42 Dizziness and giddiness: Secondary | ICD-10-CM | POA: Diagnosis not present

## 2020-09-19 DIAGNOSIS — Z0000 Encounter for general adult medical examination without abnormal findings: Secondary | ICD-10-CM | POA: Diagnosis not present

## 2020-09-19 DIAGNOSIS — F329 Major depressive disorder, single episode, unspecified: Secondary | ICD-10-CM | POA: Diagnosis not present

## 2020-09-19 DIAGNOSIS — Z8673 Personal history of transient ischemic attack (TIA), and cerebral infarction without residual deficits: Secondary | ICD-10-CM | POA: Diagnosis not present

## 2020-09-19 DIAGNOSIS — Z6822 Body mass index (BMI) 22.0-22.9, adult: Secondary | ICD-10-CM | POA: Diagnosis not present

## 2020-09-19 DIAGNOSIS — M810 Age-related osteoporosis without current pathological fracture: Secondary | ICD-10-CM | POA: Diagnosis not present

## 2020-09-21 DIAGNOSIS — M810 Age-related osteoporosis without current pathological fracture: Secondary | ICD-10-CM | POA: Diagnosis not present

## 2020-09-23 DIAGNOSIS — S90861A Insect bite (nonvenomous), right foot, initial encounter: Secondary | ICD-10-CM | POA: Diagnosis not present

## 2020-09-23 DIAGNOSIS — W57XXXA Bitten or stung by nonvenomous insect and other nonvenomous arthropods, initial encounter: Secondary | ICD-10-CM | POA: Diagnosis not present

## 2020-09-26 DIAGNOSIS — M810 Age-related osteoporosis without current pathological fracture: Secondary | ICD-10-CM | POA: Diagnosis not present

## 2020-09-26 DIAGNOSIS — Z01419 Encounter for gynecological examination (general) (routine) without abnormal findings: Secondary | ICD-10-CM | POA: Diagnosis not present

## 2020-09-26 DIAGNOSIS — Z6822 Body mass index (BMI) 22.0-22.9, adult: Secondary | ICD-10-CM | POA: Diagnosis not present

## 2020-10-02 ENCOUNTER — Other Ambulatory Visit: Payer: Self-pay | Admitting: Diagnostic Neuroimaging

## 2020-10-10 DIAGNOSIS — Z01818 Encounter for other preprocedural examination: Secondary | ICD-10-CM | POA: Diagnosis not present

## 2020-10-11 ENCOUNTER — Telehealth: Payer: Self-pay | Admitting: Diagnostic Neuroimaging

## 2020-10-11 NOTE — Telephone Encounter (Signed)
Wells Guiles from [facility] called stating they sent over a surgical clearance. She is going to fax it back over. Please be on the look out for it.

## 2020-10-25 DIAGNOSIS — M1611 Unilateral primary osteoarthritis, right hip: Secondary | ICD-10-CM | POA: Diagnosis not present

## 2020-11-03 DIAGNOSIS — M1611 Unilateral primary osteoarthritis, right hip: Secondary | ICD-10-CM | POA: Diagnosis not present

## 2020-11-14 DIAGNOSIS — Z471 Aftercare following joint replacement surgery: Secondary | ICD-10-CM | POA: Diagnosis not present

## 2020-11-14 DIAGNOSIS — Z96641 Presence of right artificial hip joint: Secondary | ICD-10-CM | POA: Diagnosis not present

## 2020-11-15 DIAGNOSIS — M25651 Stiffness of right hip, not elsewhere classified: Secondary | ICD-10-CM | POA: Diagnosis not present

## 2020-11-15 DIAGNOSIS — Z96641 Presence of right artificial hip joint: Secondary | ICD-10-CM | POA: Diagnosis not present

## 2020-11-15 DIAGNOSIS — R531 Weakness: Secondary | ICD-10-CM | POA: Diagnosis not present

## 2020-11-20 DIAGNOSIS — Z96641 Presence of right artificial hip joint: Secondary | ICD-10-CM | POA: Diagnosis not present

## 2020-11-20 DIAGNOSIS — R531 Weakness: Secondary | ICD-10-CM | POA: Diagnosis not present

## 2020-11-20 DIAGNOSIS — M25651 Stiffness of right hip, not elsewhere classified: Secondary | ICD-10-CM | POA: Diagnosis not present

## 2020-11-23 DIAGNOSIS — Z96641 Presence of right artificial hip joint: Secondary | ICD-10-CM | POA: Diagnosis not present

## 2020-11-23 DIAGNOSIS — M25651 Stiffness of right hip, not elsewhere classified: Secondary | ICD-10-CM | POA: Diagnosis not present

## 2020-11-23 DIAGNOSIS — R531 Weakness: Secondary | ICD-10-CM | POA: Diagnosis not present

## 2020-11-23 DIAGNOSIS — Z471 Aftercare following joint replacement surgery: Secondary | ICD-10-CM | POA: Diagnosis not present

## 2020-11-28 DIAGNOSIS — R531 Weakness: Secondary | ICD-10-CM | POA: Diagnosis not present

## 2020-11-28 DIAGNOSIS — Z96641 Presence of right artificial hip joint: Secondary | ICD-10-CM | POA: Diagnosis not present

## 2020-11-28 DIAGNOSIS — M25651 Stiffness of right hip, not elsewhere classified: Secondary | ICD-10-CM | POA: Diagnosis not present

## 2020-11-30 DIAGNOSIS — M25651 Stiffness of right hip, not elsewhere classified: Secondary | ICD-10-CM | POA: Diagnosis not present

## 2020-11-30 DIAGNOSIS — Z96641 Presence of right artificial hip joint: Secondary | ICD-10-CM | POA: Diagnosis not present

## 2020-11-30 DIAGNOSIS — R531 Weakness: Secondary | ICD-10-CM | POA: Diagnosis not present

## 2020-12-05 DIAGNOSIS — M25651 Stiffness of right hip, not elsewhere classified: Secondary | ICD-10-CM | POA: Diagnosis not present

## 2020-12-05 DIAGNOSIS — R531 Weakness: Secondary | ICD-10-CM | POA: Diagnosis not present

## 2020-12-05 DIAGNOSIS — Z96641 Presence of right artificial hip joint: Secondary | ICD-10-CM | POA: Diagnosis not present

## 2020-12-07 DIAGNOSIS — M25651 Stiffness of right hip, not elsewhere classified: Secondary | ICD-10-CM | POA: Diagnosis not present

## 2020-12-07 DIAGNOSIS — R531 Weakness: Secondary | ICD-10-CM | POA: Diagnosis not present

## 2020-12-07 DIAGNOSIS — Z96641 Presence of right artificial hip joint: Secondary | ICD-10-CM | POA: Diagnosis not present

## 2020-12-12 DIAGNOSIS — M25651 Stiffness of right hip, not elsewhere classified: Secondary | ICD-10-CM | POA: Diagnosis not present

## 2020-12-12 DIAGNOSIS — R531 Weakness: Secondary | ICD-10-CM | POA: Diagnosis not present

## 2020-12-12 DIAGNOSIS — Z96641 Presence of right artificial hip joint: Secondary | ICD-10-CM | POA: Diagnosis not present

## 2020-12-14 DIAGNOSIS — Z96641 Presence of right artificial hip joint: Secondary | ICD-10-CM | POA: Diagnosis not present

## 2020-12-14 DIAGNOSIS — M25651 Stiffness of right hip, not elsewhere classified: Secondary | ICD-10-CM | POA: Diagnosis not present

## 2020-12-14 DIAGNOSIS — R531 Weakness: Secondary | ICD-10-CM | POA: Diagnosis not present

## 2020-12-20 DIAGNOSIS — Z96641 Presence of right artificial hip joint: Secondary | ICD-10-CM | POA: Diagnosis not present

## 2020-12-20 DIAGNOSIS — M25651 Stiffness of right hip, not elsewhere classified: Secondary | ICD-10-CM | POA: Diagnosis not present

## 2020-12-20 DIAGNOSIS — R531 Weakness: Secondary | ICD-10-CM | POA: Diagnosis not present

## 2020-12-22 DIAGNOSIS — R531 Weakness: Secondary | ICD-10-CM | POA: Diagnosis not present

## 2020-12-22 DIAGNOSIS — M25651 Stiffness of right hip, not elsewhere classified: Secondary | ICD-10-CM | POA: Diagnosis not present

## 2020-12-22 DIAGNOSIS — Z96641 Presence of right artificial hip joint: Secondary | ICD-10-CM | POA: Diagnosis not present

## 2020-12-26 DIAGNOSIS — Z96641 Presence of right artificial hip joint: Secondary | ICD-10-CM | POA: Diagnosis not present

## 2020-12-26 DIAGNOSIS — M25651 Stiffness of right hip, not elsewhere classified: Secondary | ICD-10-CM | POA: Diagnosis not present

## 2020-12-26 DIAGNOSIS — R531 Weakness: Secondary | ICD-10-CM | POA: Diagnosis not present

## 2020-12-28 DIAGNOSIS — M25651 Stiffness of right hip, not elsewhere classified: Secondary | ICD-10-CM | POA: Diagnosis not present

## 2020-12-28 DIAGNOSIS — Z96641 Presence of right artificial hip joint: Secondary | ICD-10-CM | POA: Diagnosis not present

## 2020-12-28 DIAGNOSIS — R531 Weakness: Secondary | ICD-10-CM | POA: Diagnosis not present

## 2021-01-02 DIAGNOSIS — R531 Weakness: Secondary | ICD-10-CM | POA: Diagnosis not present

## 2021-01-02 DIAGNOSIS — M25651 Stiffness of right hip, not elsewhere classified: Secondary | ICD-10-CM | POA: Diagnosis not present

## 2021-01-02 DIAGNOSIS — Z96641 Presence of right artificial hip joint: Secondary | ICD-10-CM | POA: Diagnosis not present

## 2021-01-04 DIAGNOSIS — Z96641 Presence of right artificial hip joint: Secondary | ICD-10-CM | POA: Diagnosis not present

## 2021-01-04 DIAGNOSIS — M25651 Stiffness of right hip, not elsewhere classified: Secondary | ICD-10-CM | POA: Diagnosis not present

## 2021-01-04 DIAGNOSIS — R531 Weakness: Secondary | ICD-10-CM | POA: Diagnosis not present

## 2021-01-09 DIAGNOSIS — M25651 Stiffness of right hip, not elsewhere classified: Secondary | ICD-10-CM | POA: Diagnosis not present

## 2021-01-09 DIAGNOSIS — Z96641 Presence of right artificial hip joint: Secondary | ICD-10-CM | POA: Diagnosis not present

## 2021-01-09 DIAGNOSIS — R531 Weakness: Secondary | ICD-10-CM | POA: Diagnosis not present

## 2021-01-11 DIAGNOSIS — M25651 Stiffness of right hip, not elsewhere classified: Secondary | ICD-10-CM | POA: Diagnosis not present

## 2021-01-11 DIAGNOSIS — R531 Weakness: Secondary | ICD-10-CM | POA: Diagnosis not present

## 2021-01-11 DIAGNOSIS — Z96641 Presence of right artificial hip joint: Secondary | ICD-10-CM | POA: Diagnosis not present

## 2021-01-22 DIAGNOSIS — Z96641 Presence of right artificial hip joint: Secondary | ICD-10-CM | POA: Diagnosis not present

## 2021-01-22 DIAGNOSIS — M25651 Stiffness of right hip, not elsewhere classified: Secondary | ICD-10-CM | POA: Diagnosis not present

## 2021-01-22 DIAGNOSIS — R531 Weakness: Secondary | ICD-10-CM | POA: Diagnosis not present

## 2021-01-29 IMAGING — CT CT ANGIO HEAD
2 of 7 series · 8 of 33 positions shown · IV contrast (APPLIED)
Comparison: None.

CLINICAL DATA: Acute neurologic deficit

EXAM:
CT ANGIOGRAPHY HEAD AND NECK
TECHNIQUE: Multidetector CT imaging of the head and neck was performed using
the standard protocol during bolus administration of intravenous
contrast. Multiplanar CT image reconstructions and MIPs were
obtained to evaluate the vascular anatomy. Carotid stenosis
measurements (when applicable) are obtained utilizing NASCET
criteria, using the distal internal carotid diameter as the
denominator.
CONTRAST:  100mL OMNIPAQUE IOHEXOL 350 MG/ML SOLN

[Series 5: cta neck/head · axial · 0.44mm/px · z∈[+1127,+1247]mm · 2 of 181 slices shown]
[im 61/181  soft-tissue]
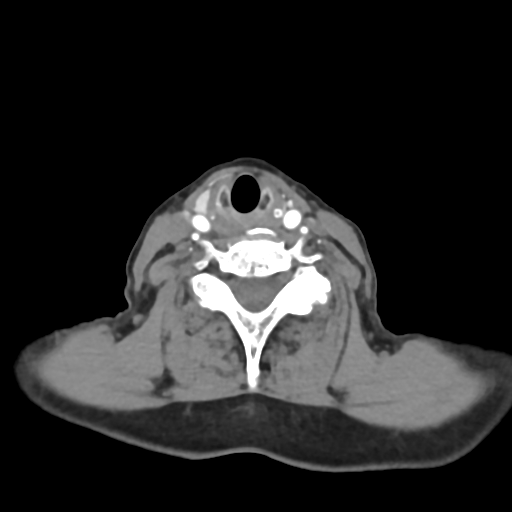
[im 121/181  soft-tissue]
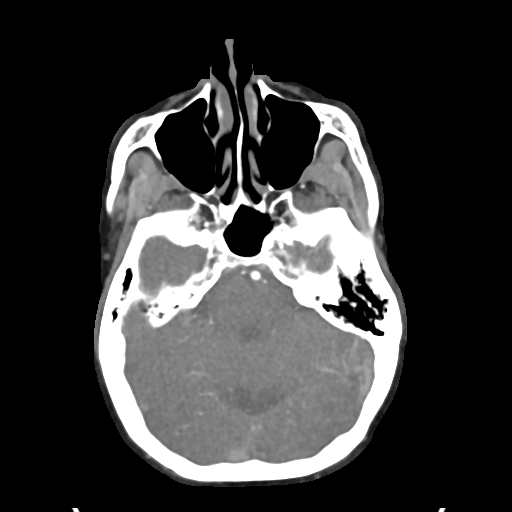

[Series 7: ax thins · axial · 0.42mm/px · z∈[+1058,+1316]mm · 6 of 362 slices shown]
[im 52/362  soft-tissue]
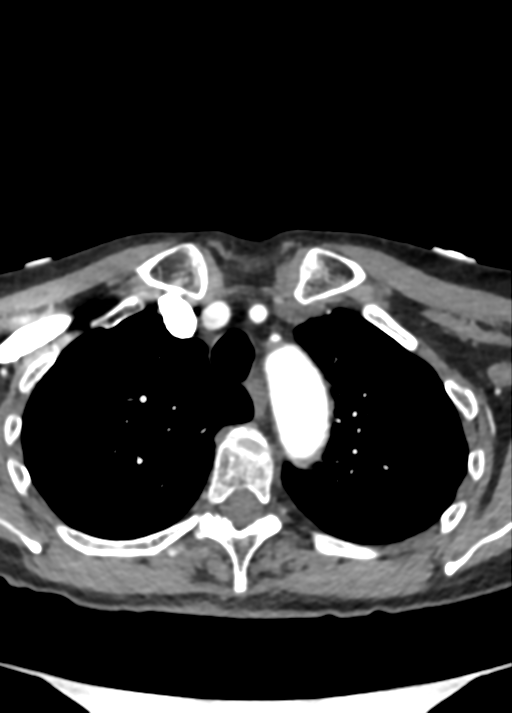
[im 104/362  bone]
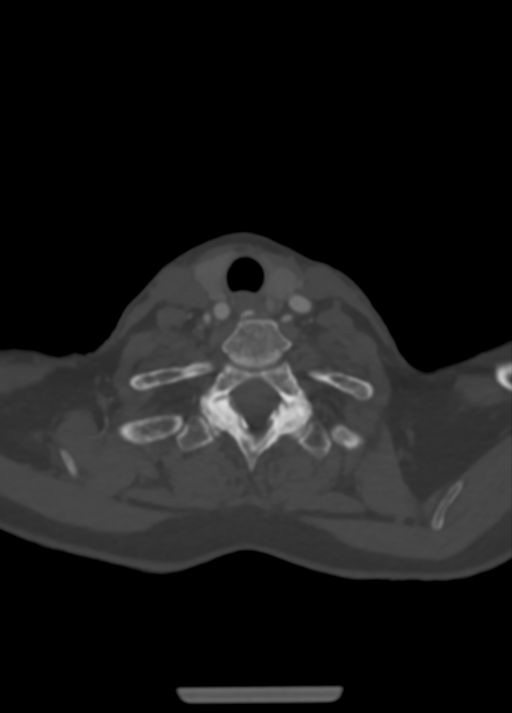
[im 155/362  soft-tissue]
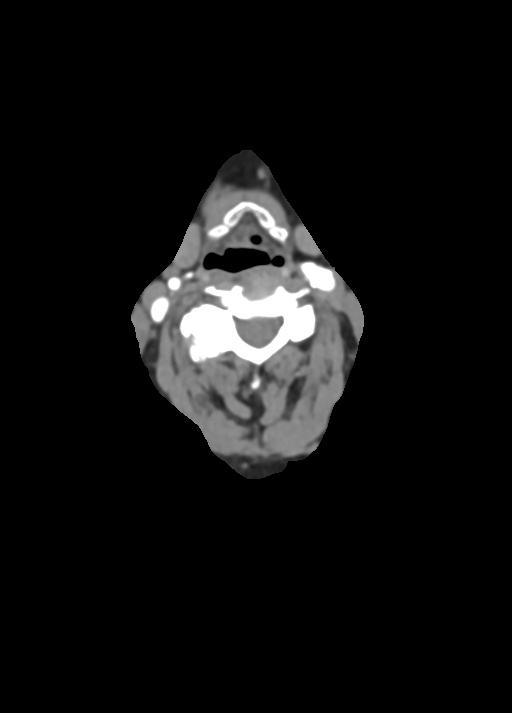
[im 207/362  bone]
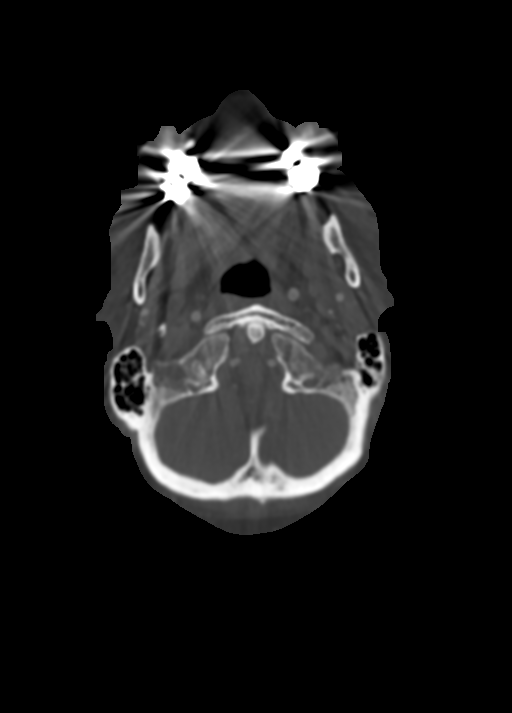
[im 258/362  soft-tissue]
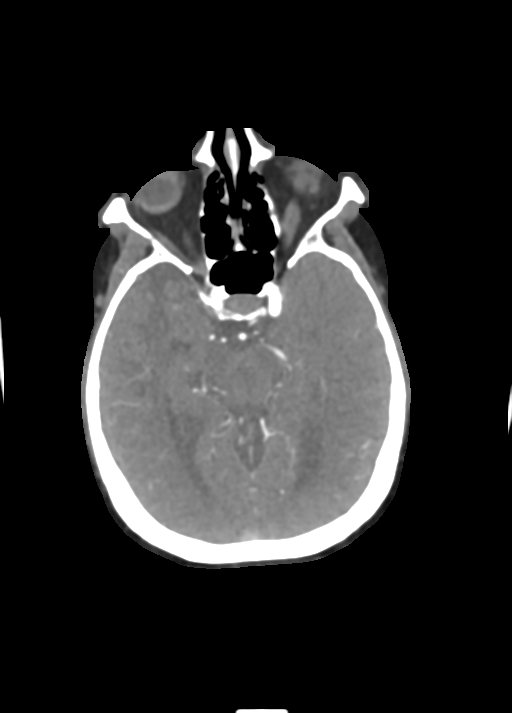
[im 310/362  bone]
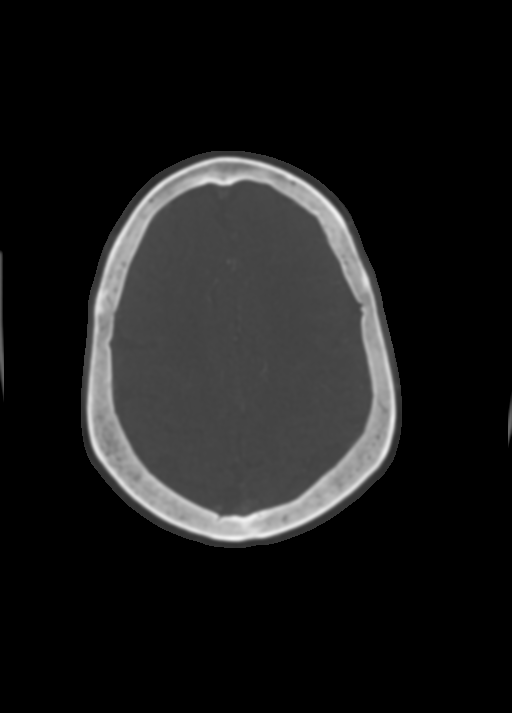

[8 of 33 positions shown; findings below may reference images not displayed]

FINDINGS: CTA NECK FINDINGS

SKELETON: There is no bony spinal canal stenosis. No lytic or
blastic lesion.

OTHER NECK: Normal pharynx, larynx and major salivary glands. No
cervical lymphadenopathy. Unremarkable thyroid gland.

UPPER CHEST: No pneumothorax or pleural effusion. No nodules or
masses.

AORTIC ARCH:

There is no calcific atherosclerosis of the aortic arch. There is no
aneurysm, dissection or hemodynamically significant stenosis of the
visualized portion of the aorta. Conventional 3 vessel aortic
branching pattern. The visualized proximal subclavian arteries are
widely patent.

RIGHT CAROTID SYSTEM: No dissection, occlusion or aneurysm. Mild
atherosclerotic calcification at the carotid bifurcation without
hemodynamically significant stenosis.

LEFT CAROTID SYSTEM: No dissection, occlusion or aneurysm. Mild
atherosclerotic calcification at the carotid bifurcation without
hemodynamically significant stenosis.

VERTEBRAL ARTERIES: Left dominant configuration. Both origins are
clearly patent. There is no dissection, occlusion or flow-limiting
stenosis to the skull base (V1-V3 segments).

CTA HEAD FINDINGS

POSTERIOR CIRCULATION:

--Vertebral arteries: Normal V4 segments.

--Inferior cerebellar arteries: Normal.

--Basilar artery: Normal.

--Superior cerebellar arteries: Normal.

--Posterior cerebral arteries (PCA): Normal.

ANTERIOR CIRCULATION:

--Intracranial internal carotid arteries: Normal.

--Anterior cerebral arteries (ACA): Normal. Both A1 segments are
present. Patent anterior communicating artery (a-comm).

--Middle cerebral arteries (MCA): Mild narrowing of the proximal
right M2 segment. Moderate narrowing of the midportion of the left
M2 inferior division.

VENOUS SINUSES: As permitted by contrast timing, patent.

ANATOMIC VARIANTS: Fetal origin of the left posterior cerebral
artery.

Review of the MIP images confirms the above findings.
IMPRESSION: 1. No emergent large vessel occlusion or high-grade stenosis of the
intracranial arteries.
2. Moderate narrowing of the midportion of the left MCA M2 inferior
division.
3. Mild bilateral carotid bifurcation atherosclerosis without
hemodynamically significant stenosis by NASCET criteria.

## 2021-03-19 DIAGNOSIS — H353132 Nonexudative age-related macular degeneration, bilateral, intermediate dry stage: Secondary | ICD-10-CM | POA: Diagnosis not present

## 2021-03-19 DIAGNOSIS — Z961 Presence of intraocular lens: Secondary | ICD-10-CM | POA: Diagnosis not present

## 2021-03-19 DIAGNOSIS — H43812 Vitreous degeneration, left eye: Secondary | ICD-10-CM | POA: Diagnosis not present

## 2021-03-19 DIAGNOSIS — H40013 Open angle with borderline findings, low risk, bilateral: Secondary | ICD-10-CM | POA: Diagnosis not present

## 2021-03-23 DIAGNOSIS — M810 Age-related osteoporosis without current pathological fracture: Secondary | ICD-10-CM | POA: Diagnosis not present

## 2021-03-29 DIAGNOSIS — M810 Age-related osteoporosis without current pathological fracture: Secondary | ICD-10-CM | POA: Diagnosis not present

## 2021-09-17 DIAGNOSIS — H353132 Nonexudative age-related macular degeneration, bilateral, intermediate dry stage: Secondary | ICD-10-CM | POA: Diagnosis not present

## 2021-09-17 DIAGNOSIS — H43812 Vitreous degeneration, left eye: Secondary | ICD-10-CM | POA: Diagnosis not present

## 2021-09-17 DIAGNOSIS — H40013 Open angle with borderline findings, low risk, bilateral: Secondary | ICD-10-CM | POA: Diagnosis not present

## 2021-09-17 DIAGNOSIS — H26493 Other secondary cataract, bilateral: Secondary | ICD-10-CM | POA: Diagnosis not present

## 2021-10-07 DIAGNOSIS — N390 Urinary tract infection, site not specified: Secondary | ICD-10-CM | POA: Diagnosis not present

## 2021-10-07 DIAGNOSIS — R0982 Postnasal drip: Secondary | ICD-10-CM | POA: Diagnosis not present

## 2021-10-07 DIAGNOSIS — U071 COVID-19: Secondary | ICD-10-CM | POA: Diagnosis not present

## 2021-10-09 DIAGNOSIS — R0982 Postnasal drip: Secondary | ICD-10-CM | POA: Diagnosis not present

## 2021-10-09 DIAGNOSIS — U071 COVID-19: Secondary | ICD-10-CM | POA: Diagnosis not present

## 2021-10-09 DIAGNOSIS — N390 Urinary tract infection, site not specified: Secondary | ICD-10-CM | POA: Diagnosis not present

## 2021-10-16 DIAGNOSIS — M810 Age-related osteoporosis without current pathological fracture: Secondary | ICD-10-CM | POA: Diagnosis not present

## 2021-10-24 DIAGNOSIS — M810 Age-related osteoporosis without current pathological fracture: Secondary | ICD-10-CM | POA: Diagnosis not present

## 2021-11-01 DIAGNOSIS — Z6822 Body mass index (BMI) 22.0-22.9, adult: Secondary | ICD-10-CM | POA: Diagnosis not present

## 2021-11-01 DIAGNOSIS — Z01419 Encounter for gynecological examination (general) (routine) without abnormal findings: Secondary | ICD-10-CM | POA: Diagnosis not present

## 2021-11-27 DIAGNOSIS — M8588 Other specified disorders of bone density and structure, other site: Secondary | ICD-10-CM | POA: Diagnosis not present

## 2021-11-27 DIAGNOSIS — Z7983 Long term (current) use of bisphosphonates: Secondary | ICD-10-CM | POA: Diagnosis not present

## 2021-11-27 DIAGNOSIS — R29890 Loss of height: Secondary | ICD-10-CM | POA: Diagnosis not present

## 2021-11-27 DIAGNOSIS — N958 Other specified menopausal and perimenopausal disorders: Secondary | ICD-10-CM | POA: Diagnosis not present

## 2021-12-31 DIAGNOSIS — Z23 Encounter for immunization: Secondary | ICD-10-CM | POA: Diagnosis not present

## 2021-12-31 DIAGNOSIS — Z6822 Body mass index (BMI) 22.0-22.9, adult: Secondary | ICD-10-CM | POA: Diagnosis not present

## 2021-12-31 DIAGNOSIS — Z0000 Encounter for general adult medical examination without abnormal findings: Secondary | ICD-10-CM | POA: Diagnosis not present

## 2021-12-31 DIAGNOSIS — F329 Major depressive disorder, single episode, unspecified: Secondary | ICD-10-CM | POA: Diagnosis not present

## 2021-12-31 DIAGNOSIS — Z1389 Encounter for screening for other disorder: Secondary | ICD-10-CM | POA: Diagnosis not present

## 2021-12-31 DIAGNOSIS — Z136 Encounter for screening for cardiovascular disorders: Secondary | ICD-10-CM | POA: Diagnosis not present

## 2021-12-31 DIAGNOSIS — M810 Age-related osteoporosis without current pathological fracture: Secondary | ICD-10-CM | POA: Diagnosis not present

## 2021-12-31 DIAGNOSIS — Z5181 Encounter for therapeutic drug level monitoring: Secondary | ICD-10-CM | POA: Diagnosis not present

## 2021-12-31 DIAGNOSIS — Z8673 Personal history of transient ischemic attack (TIA), and cerebral infarction without residual deficits: Secondary | ICD-10-CM | POA: Diagnosis not present

## 2022-03-06 DIAGNOSIS — H40013 Open angle with borderline findings, low risk, bilateral: Secondary | ICD-10-CM | POA: Diagnosis not present

## 2022-03-06 DIAGNOSIS — H43812 Vitreous degeneration, left eye: Secondary | ICD-10-CM | POA: Diagnosis not present

## 2022-03-06 DIAGNOSIS — H353132 Nonexudative age-related macular degeneration, bilateral, intermediate dry stage: Secondary | ICD-10-CM | POA: Diagnosis not present

## 2022-03-06 DIAGNOSIS — H26493 Other secondary cataract, bilateral: Secondary | ICD-10-CM | POA: Diagnosis not present

## 2022-04-01 DIAGNOSIS — H353132 Nonexudative age-related macular degeneration, bilateral, intermediate dry stage: Secondary | ICD-10-CM | POA: Diagnosis not present

## 2022-04-01 DIAGNOSIS — H26493 Other secondary cataract, bilateral: Secondary | ICD-10-CM | POA: Diagnosis not present

## 2022-04-01 DIAGNOSIS — H26491 Other secondary cataract, right eye: Secondary | ICD-10-CM | POA: Diagnosis not present

## 2022-04-01 DIAGNOSIS — H40013 Open angle with borderline findings, low risk, bilateral: Secondary | ICD-10-CM | POA: Diagnosis not present

## 2022-05-01 DIAGNOSIS — M810 Age-related osteoporosis without current pathological fracture: Secondary | ICD-10-CM | POA: Diagnosis not present

## 2022-05-13 DIAGNOSIS — M810 Age-related osteoporosis without current pathological fracture: Secondary | ICD-10-CM | POA: Diagnosis not present

## 2022-05-15 DIAGNOSIS — H26492 Other secondary cataract, left eye: Secondary | ICD-10-CM | POA: Diagnosis not present

## 2022-09-30 DIAGNOSIS — N952 Postmenopausal atrophic vaginitis: Secondary | ICD-10-CM | POA: Diagnosis not present

## 2022-09-30 DIAGNOSIS — N390 Urinary tract infection, site not specified: Secondary | ICD-10-CM | POA: Diagnosis not present

## 2022-09-30 DIAGNOSIS — R300 Dysuria: Secondary | ICD-10-CM | POA: Diagnosis not present

## 2022-11-11 DIAGNOSIS — M810 Age-related osteoporosis without current pathological fracture: Secondary | ICD-10-CM | POA: Diagnosis not present

## 2022-11-13 DIAGNOSIS — Z01419 Encounter for gynecological examination (general) (routine) without abnormal findings: Secondary | ICD-10-CM | POA: Diagnosis not present

## 2022-11-13 DIAGNOSIS — Z6821 Body mass index (BMI) 21.0-21.9, adult: Secondary | ICD-10-CM | POA: Diagnosis not present

## 2023-01-15 DIAGNOSIS — Z800 Family history of malignant neoplasm of digestive organs: Secondary | ICD-10-CM | POA: Diagnosis not present

## 2023-01-15 DIAGNOSIS — Z09 Encounter for follow-up examination after completed treatment for conditions other than malignant neoplasm: Secondary | ICD-10-CM | POA: Diagnosis not present

## 2023-01-15 DIAGNOSIS — Z860100 Personal history of colon polyps, unspecified: Secondary | ICD-10-CM | POA: Diagnosis not present

## 2023-02-14 DIAGNOSIS — H353112 Nonexudative age-related macular degeneration, right eye, intermediate dry stage: Secondary | ICD-10-CM | POA: Diagnosis not present

## 2023-02-14 DIAGNOSIS — H353221 Exudative age-related macular degeneration, left eye, with active choroidal neovascularization: Secondary | ICD-10-CM | POA: Diagnosis not present

## 2023-02-14 DIAGNOSIS — H40013 Open angle with borderline findings, low risk, bilateral: Secondary | ICD-10-CM | POA: Diagnosis not present

## 2023-02-17 DIAGNOSIS — Z961 Presence of intraocular lens: Secondary | ICD-10-CM | POA: Diagnosis not present

## 2023-02-17 DIAGNOSIS — H353112 Nonexudative age-related macular degeneration, right eye, intermediate dry stage: Secondary | ICD-10-CM | POA: Diagnosis not present

## 2023-02-17 DIAGNOSIS — H43813 Vitreous degeneration, bilateral: Secondary | ICD-10-CM | POA: Diagnosis not present

## 2023-02-17 DIAGNOSIS — H353221 Exudative age-related macular degeneration, left eye, with active choroidal neovascularization: Secondary | ICD-10-CM | POA: Diagnosis not present

## 2023-02-25 DIAGNOSIS — H353221 Exudative age-related macular degeneration, left eye, with active choroidal neovascularization: Secondary | ICD-10-CM | POA: Diagnosis not present

## 2023-03-03 DIAGNOSIS — Z860100 Personal history of colon polyps, unspecified: Secondary | ICD-10-CM | POA: Diagnosis not present

## 2023-03-03 DIAGNOSIS — Z8673 Personal history of transient ischemic attack (TIA), and cerebral infarction without residual deficits: Secondary | ICD-10-CM | POA: Diagnosis not present

## 2023-03-03 DIAGNOSIS — E7800 Pure hypercholesterolemia, unspecified: Secondary | ICD-10-CM | POA: Diagnosis not present

## 2023-03-03 DIAGNOSIS — F329 Major depressive disorder, single episode, unspecified: Secondary | ICD-10-CM | POA: Diagnosis not present

## 2023-03-03 DIAGNOSIS — M810 Age-related osteoporosis without current pathological fracture: Secondary | ICD-10-CM | POA: Diagnosis not present

## 2023-03-03 DIAGNOSIS — Z800 Family history of malignant neoplasm of digestive organs: Secondary | ICD-10-CM | POA: Diagnosis not present

## 2023-03-24 DIAGNOSIS — H353221 Exudative age-related macular degeneration, left eye, with active choroidal neovascularization: Secondary | ICD-10-CM | POA: Diagnosis not present

## 2023-04-02 DIAGNOSIS — R32 Unspecified urinary incontinence: Secondary | ICD-10-CM | POA: Diagnosis not present

## 2023-04-22 DIAGNOSIS — H353221 Exudative age-related macular degeneration, left eye, with active choroidal neovascularization: Secondary | ICD-10-CM | POA: Diagnosis not present

## 2023-05-13 DIAGNOSIS — M810 Age-related osteoporosis without current pathological fracture: Secondary | ICD-10-CM | POA: Diagnosis not present

## 2023-06-03 DIAGNOSIS — H353221 Exudative age-related macular degeneration, left eye, with active choroidal neovascularization: Secondary | ICD-10-CM | POA: Diagnosis not present

## 2023-06-03 DIAGNOSIS — Z961 Presence of intraocular lens: Secondary | ICD-10-CM | POA: Diagnosis not present

## 2023-06-03 DIAGNOSIS — H353112 Nonexudative age-related macular degeneration, right eye, intermediate dry stage: Secondary | ICD-10-CM | POA: Diagnosis not present

## 2023-06-03 DIAGNOSIS — H43813 Vitreous degeneration, bilateral: Secondary | ICD-10-CM | POA: Diagnosis not present

## 2023-06-04 DIAGNOSIS — M810 Age-related osteoporosis without current pathological fracture: Secondary | ICD-10-CM | POA: Diagnosis not present

## 2023-07-29 DIAGNOSIS — H353221 Exudative age-related macular degeneration, left eye, with active choroidal neovascularization: Secondary | ICD-10-CM | POA: Diagnosis not present

## 2023-10-13 DIAGNOSIS — H353221 Exudative age-related macular degeneration, left eye, with active choroidal neovascularization: Secondary | ICD-10-CM | POA: Diagnosis not present

## 2023-11-12 DIAGNOSIS — M810 Age-related osteoporosis without current pathological fracture: Secondary | ICD-10-CM | POA: Diagnosis not present

## 2023-12-04 DIAGNOSIS — Z01419 Encounter for gynecological examination (general) (routine) without abnormal findings: Secondary | ICD-10-CM | POA: Diagnosis not present

## 2023-12-04 DIAGNOSIS — Z6821 Body mass index (BMI) 21.0-21.9, adult: Secondary | ICD-10-CM | POA: Diagnosis not present

## 2023-12-04 DIAGNOSIS — M810 Age-related osteoporosis without current pathological fracture: Secondary | ICD-10-CM | POA: Diagnosis not present

## 2023-12-24 DIAGNOSIS — N958 Other specified menopausal and perimenopausal disorders: Secondary | ICD-10-CM | POA: Diagnosis not present

## 2023-12-24 DIAGNOSIS — M8588 Other specified disorders of bone density and structure, other site: Secondary | ICD-10-CM | POA: Diagnosis not present

## 2024-01-06 DIAGNOSIS — Z961 Presence of intraocular lens: Secondary | ICD-10-CM | POA: Diagnosis not present

## 2024-01-06 DIAGNOSIS — H353112 Nonexudative age-related macular degeneration, right eye, intermediate dry stage: Secondary | ICD-10-CM | POA: Diagnosis not present

## 2024-01-06 DIAGNOSIS — H43813 Vitreous degeneration, bilateral: Secondary | ICD-10-CM | POA: Diagnosis not present

## 2024-01-06 DIAGNOSIS — H353221 Exudative age-related macular degeneration, left eye, with active choroidal neovascularization: Secondary | ICD-10-CM | POA: Diagnosis not present

## 2024-01-21 DIAGNOSIS — R300 Dysuria: Secondary | ICD-10-CM | POA: Diagnosis not present
